# Patient Record
Sex: Male | Born: 1960 | Race: White | Hispanic: No | Marital: Married | Smoking: Former smoker
Health system: Southern US, Community
[De-identification: ages and names within clinical notes are randomized; demographics above are authoritative.]

## PROBLEM LIST (undated history)

## (undated) DIAGNOSIS — E7800 Pure hypercholesterolemia, unspecified: Secondary | ICD-10-CM

## (undated) DIAGNOSIS — F909 Attention-deficit hyperactivity disorder, unspecified type: Secondary | ICD-10-CM

## (undated) DIAGNOSIS — R7303 Prediabetes: Secondary | ICD-10-CM

## (undated) DIAGNOSIS — G4700 Insomnia, unspecified: Secondary | ICD-10-CM

## (undated) DIAGNOSIS — M2550 Pain in unspecified joint: Secondary | ICD-10-CM

## (undated) DIAGNOSIS — K829 Disease of gallbladder, unspecified: Secondary | ICD-10-CM

## (undated) DIAGNOSIS — F329 Major depressive disorder, single episode, unspecified: Secondary | ICD-10-CM

## (undated) DIAGNOSIS — G4730 Sleep apnea, unspecified: Secondary | ICD-10-CM

## (undated) DIAGNOSIS — E559 Vitamin D deficiency, unspecified: Secondary | ICD-10-CM

## (undated) DIAGNOSIS — F32A Depression, unspecified: Secondary | ICD-10-CM

## (undated) DIAGNOSIS — I10 Essential (primary) hypertension: Secondary | ICD-10-CM

## (undated) DIAGNOSIS — F1010 Alcohol abuse, uncomplicated: Secondary | ICD-10-CM

## (undated) DIAGNOSIS — F419 Anxiety disorder, unspecified: Secondary | ICD-10-CM

## (undated) DIAGNOSIS — R0602 Shortness of breath: Secondary | ICD-10-CM

## (undated) DIAGNOSIS — M549 Dorsalgia, unspecified: Secondary | ICD-10-CM

## (undated) HISTORY — DX: Prediabetes: R73.03

## (undated) HISTORY — DX: Pain in unspecified joint: M25.50

## (undated) HISTORY — PX: HERNIA REPAIR: SHX51

## (undated) HISTORY — DX: Sleep apnea, unspecified: G47.30

## (undated) HISTORY — DX: Essential (primary) hypertension: I10

## (undated) HISTORY — PX: OTHER SURGICAL HISTORY: SHX169

## (undated) HISTORY — PX: COLONOSCOPY: SHX174

## (undated) HISTORY — DX: Attention-deficit hyperactivity disorder, unspecified type: F90.9

## (undated) HISTORY — DX: Anxiety disorder, unspecified: F41.9

## (undated) HISTORY — DX: Alcohol abuse, uncomplicated: F10.10

## (undated) HISTORY — DX: Depression, unspecified: F32.A

## (undated) HISTORY — DX: Dorsalgia, unspecified: M54.9

## (undated) HISTORY — DX: Vitamin D deficiency, unspecified: E55.9

## (undated) HISTORY — DX: Shortness of breath: R06.02

## (undated) HISTORY — DX: Insomnia, unspecified: G47.00

## (undated) HISTORY — PX: POLYPECTOMY: SHX149

## (undated) HISTORY — PX: CHOLECYSTECTOMY: SHX55

## (undated) HISTORY — DX: Major depressive disorder, single episode, unspecified: F32.9

## (undated) HISTORY — DX: Pure hypercholesterolemia, unspecified: E78.00

## (undated) HISTORY — DX: Disease of gallbladder, unspecified: K82.9

---

## 2002-03-16 ENCOUNTER — Ambulatory Visit: Admission: RE | Admit: 2002-03-16 | Discharge: 2002-03-16 | Payer: Self-pay | Admitting: Family Medicine

## 2003-12-28 ENCOUNTER — Ambulatory Visit (HOSPITAL_COMMUNITY): Admission: RE | Admit: 2003-12-28 | Discharge: 2003-12-28 | Payer: Self-pay | Admitting: General Surgery

## 2004-05-06 ENCOUNTER — Inpatient Hospital Stay (HOSPITAL_COMMUNITY): Admission: EM | Admit: 2004-05-06 | Discharge: 2004-05-08 | Payer: Self-pay | Admitting: Emergency Medicine

## 2004-05-09 ENCOUNTER — Ambulatory Visit (HOSPITAL_COMMUNITY): Admission: RE | Admit: 2004-05-09 | Discharge: 2004-05-10 | Payer: Self-pay | Admitting: *Deleted

## 2004-06-21 ENCOUNTER — Ambulatory Visit (HOSPITAL_COMMUNITY): Admission: RE | Admit: 2004-06-21 | Discharge: 2004-06-21 | Payer: Self-pay | Admitting: Orthopaedic Surgery

## 2004-10-03 ENCOUNTER — Ambulatory Visit (HOSPITAL_COMMUNITY): Admission: RE | Admit: 2004-10-03 | Discharge: 2004-10-03 | Payer: Self-pay | Admitting: *Deleted

## 2004-10-03 ENCOUNTER — Ambulatory Visit (HOSPITAL_BASED_OUTPATIENT_CLINIC_OR_DEPARTMENT_OTHER): Admission: RE | Admit: 2004-10-03 | Discharge: 2004-10-03 | Payer: Self-pay | Admitting: *Deleted

## 2009-02-08 ENCOUNTER — Ambulatory Visit (HOSPITAL_COMMUNITY): Admission: RE | Admit: 2009-02-08 | Discharge: 2009-02-08 | Payer: Self-pay | Admitting: Family Medicine
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
## 2011-04-19 NOTE — H&P (Signed)
NAME:  Harold Trujillo, Harold Trujillo NO.:  [id number]   MEDICAL RECORD NO.:  [id number]                  PATIENT TYPE:   LOCATION:                                       FACILITY:   PHYSICIAN:  Dalia Heading, M.D.               DATE OF BIRTH:  Apr 02, 1961   DATE OF ADMISSION:  DATE OF DISCHARGE:                                HISTORY & PHYSICAL   CHIEF COMPLAINT:  Ventral hernia.   HISTORY OF PRESENT ILLNESS:  Patient is a 50 year old white male who was  referred for evaluation and treatment of a ventral hernia.  He was lifting  something heavy last week and felt something pop just above his umbilicus.  He has a mass which comes and goes.  It is occasionally sore to touch.  No  nausea or vomiting have been noted.   PAST MEDICAL HISTORY:  Depression.   PAST SURGICAL HISTORY:  Open cholecystectomy in the remote past.   CURRENT MEDICATIONS:  Paxil 20 mg p.o. q.d.   ALLERGIES:  PENICILLIN.   REVIEW OF SYSTEMS:  Patient does smoke a pack of cigarettes a day.  He  socially drinks alcohol.  He denies any other cardiopulmonary difficulties  or bleeding disorders.   PHYSICAL EXAMINATION:  VITAL SIGNS:  He is afebrile.  Vital signs are  stable.  GENERAL:  Patient is a well-developed and well-nourished white male in no  acute distress.  LUNGS:  Clear to auscultation with equal breath sounds bilaterally.  HEART:  Regular rate and rhythm without S3, S4, or murmurs.  ABDOMEN:  Soft and nondistended.  No hepatosplenomegaly or masses are noted.  There is a small, reducible ventral hernia that is noted just superior to  the umbilicus.   IMPRESSION:  Ventral hernia.   PLAN:  The patient is scheduled for a ventral herniorrhaphy on December 28, 2003.  The risks and benefits of the procedure, including bleeding,  infection, and the risks of recurrence of the hernia were fully explained to  the patient.  Gave informed consent.     ___________________________________________                                         Dalia Heading, M.D.   MAJ/MEDQ  D:  12/20/2003  T:  12/20/2003  Job:  [id number]   cc:   Donna Bernard, M.D.  [address]  [place]  [place] [address]  Fax: [phone]

## 2011-04-19 NOTE — Op Note (Signed)
NAME:  Harold Trujillo, Harold Trujillo                        ACCOUNT NO.:  [id number]   MEDICAL RECORD NO.:  [id number]                   PATIENT TYPE:  OIB   LOCATION:  3715                                 FACILITY:  [facility]   PHYSICIAN:  Lowell Bouton, M.D.      DATE OF BIRTH:  [birth year]   DATE OF PROCEDURE:  05/09/2004  DATE OF DISCHARGE:                                 OPERATIVE REPORT   PREOPERATIVE DIAGNOSIS:  Galeazzi fracture, left forearm.   POSTOPERATIVE DIAGNOSIS:  Galeazzi fracture, left forearm.   PROCEDURE:  Open reduction and internal fixation, left radius fracture, with  examination of distal radioulnar joint.   SURGEON:  Lowell Bouton, M.D.   ANESTHESIA:  General.   OPERATIVE FINDINGS:  The patient had a midshaft radius fracture that had a  small butterfly fragment.  There were good interdigitations of the fracture.  The distal radioulnar joint was dorsally dislocated.  Following plating of  the radius, the distal radioulnar joint was stable and did not require  pinning.   PROCEDURE:  Under general anesthesia with the tourniquet on the left arm,  the left hand was prepped and draped in the usual fashion and after  exsanguinating the limb, the tourniquet was inflated to 250 mmHg.  A  longitudinal incision was made volarly adjacent to the FCR tendon.  An  anterior Sherilyn Cooter approach was made to the radius.  Blunt dissection was  carried down through the fascia of the FCR, and it was retracted ulnarly.  The blunt dissection was carried down to the radial artery, which was  retracted ulnarly.  Branches of the artery were coagulated.  The interval  between the artery and the brachioradialis was identified and care was taken  to protect the radial sensory nerve.  The blunt dissection was carried down  to the pronator teres.  The forearm was pronated and the distal portion of  the pronator teres was elevated from the radius.  This was done sharply.  The fracture  site was just distal to the pronator teres insertion.  The  fractured ends of the bone were then cleaned of hematoma using a curette  both proximally and distally.  Two large clamps were applied to the proximal  and distal fragments of the radius and with longitudinal traction and  manipulation of the clamp, the fracture was reduced anatomically.  There  were good interdigitations to reveal the correct alignment of the bone.  There was a small butterfly fragment that was left adjacent to the fracture.  A five-hole large locking plate was used from the AO locking system.  The  plate was applied to the radius using the whirligig and a locking hole.  Two  locking screws were then placed distal to the fracture, holding it well-  aligned.  A third locking screw was placed in the most proximal hole and  then the whirligig was removed.  A fourth locking screw was placed proximal  to the  fracture.  X-rays revealed good alignment of the fracture, and there  was good stability.  Compression did not need to be applied, as the fracture  interdigitated well.  After plating the radius and obtaining x-rays, which  showed anatomic alignment, the distal radioulnar joint was examined and was  found to be completely stable.  It was then examined under fluoroscopy and  there was no dorsal subluxation of the ulna.  The joint appeared to be  stable.  The wound was then irrigated copiously with saline.  Subcutaneous  tissue was closed  with 4-0 Vicryl over a vessel loop drain.  The skin was closed with a 3-0  subcuticular Prolene.  Steri-Strips were applied, followed by sterile  dressings and a volar wrist splint.  The patient tolerated the procedure  well and went to the recovery room awake and stable, in good condition.                                               Lowell Bouton, M.D.    EMM/MEDQ  D:  05/09/2004  T:  05/10/2004  Job:  [id number]   cc:   Teola Bradley, M.D.  [phone] [address]  [place] [address]  Fax: [phone]   Donna Bernard, M.D.  [address]  [place]  [place] [address]  Fax: [phone]

## 2011-04-19 NOTE — Procedures (Signed)
NAME:  Harold Trujillo, Harold Trujillo NO.:  [id number]   MEDICAL RECORD NO.:  [id number]          PATIENT TYPE:  OUT   LOCATION:  DFTL                          FACILITY:  [facility]   PHYSICIAN:  Donna Bernard, M.D.DATE OF BIRTH:  [birth year]   DATE OF PROCEDURE:  DATE OF DISCHARGE:  02/08/2009                                  STRESS TEST   INDICATION FOR TEST:  This patient is a 50 year old white male with a  history of tobacco abuse and atypical chest discomfort.  Stress test was  done for risk stratification purposes.   Stress test was performed at standard Bruce protocol.  Resting EKG  revealed normal sinus rhythm with no significant ST-T changes.  The  patient tolerated the first 3 stages surprisingly well.  He made it to a  full minute and 30 seconds into the third stage.  At this point, at 0.08  seconds past the J-point, there were no significant ST-segment  depression noted in any of the leads.  The slope was sharply ascending  and the amount of depression was less than a mm.  The patient had  hypertensive responses expected during the recovery phase.  His EKG  normalized quickly.   IMPRESSION:  Negative adequate stress test.   PLAN:  The patient encouraged to get along with exercise program.      W. Simone Curia, M.D.  Electronically Signed     WSL/MEDQ  D:  02/17/2009  T:  02/18/2009  Job:  [id number]

## 2011-04-19 NOTE — Op Note (Signed)
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   PROCEDURE:  Open reduction-internal fixation, left radius fracture with left  iliac crest bone grafting.   SURGEON:  Lowell Bouton, M.D.   ASSISTANT:  Vanita Panda. Magnus Ivan, M.D.   ANESTHESIA:  General.   OPERATIVE FINDINGS:  The patient had bowing of his radius with subluxation  of his distal radial ulnar joint.  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   DESCRIPTION OF PROCEDURE:  Under general anesthesia with the tourniquet on  the left arm, the left arm and the left iliac crest were prepped and draped  in the usual fashion.  After exsanguinating the limb, the tourniquet was  inflated to 250 mmHg.  A longitudinal incision was made over the volar  aspect of the radius in line with the previous scar and extended both  proximally and distally.  Sharp dissection was carried through the  subcutaneous tissue and bleeding points were coagulated.  Blunt dissection  was carried through the subcutaneous tissue down to the FCR tendon which was  retracted ulnarly.  The radial artery was retracted radially.  The interval  was bluntly dissected out and the flexor tendons were retracted ulnarly.  Weitlaner retractors were inserted.  The forearm was supinated and the  supinator was removed from its attachment to the radius.  Similarly, the  pronator teres was released  from the attachment to the radius.  Dissection  was then carried down sharply onto the bone and there was bony callous  formation on either side of the fracture but there was fibrous tissue at the  fracture site.  The previous locking plate was removed and there was obvious  motion at the previous fracture site.  The fracture was then taken down with  a rongeur and a Freer elevator to free up the ends of the bone.  There was  some shortening with rounding off of the ends of the bone.  These were  distracted without difficulty using bone clamp and finally a laminar  spreader.  After distracting the bones gently over a period of time, x-rays  were taken showing reduction of the distal radial ulnar joint.  It was  decided that a 1-cm corticocancellous graft would be required to maintain  the length of the radius.  This was then taken from the left anterior iliac  crest by Dr. Magnus Ivan.  He used a saw to remove a 1-cm corticocancellous  graft that was tricortical from the top of the crest.  He then removed  cancellous bone for grafting.  The wound on the hip was then irrigated.  At  this point, it had been approximately 2 hours on the tourniquet so the  tourniquet was released.  The bone graft donor site was then packed with  Gelfoam and then the fascia was closed with 0 Vicryl, the subcutaneous  tissue was closed with 2-0 Vicryl and the skin with staples.  Sterile  dressings were applied.   The bone graft was then inserted in the defect in the radius after trimming  the edges of the rounded off radius back with a saw.  Care was taken to  irrigate copiously during any sawing to avoid burning of the bone.  The  graft was then inserted and x-rays showed adequate length and so a 0.062 K-  wire was placed longitudinally down the shaft of the radius to hold this  temporarily with the graft in place.  A 6-hole DC plate from the AO set was  then applied volarly to the radius, using six screws that  allowed for six  cortices on either side of the fracture.  X-rays showed good alignment of  the bone with a slight offset between the ends of the bone.  The patient had  good rotation on the table and it appeared as though his distal radial ulnar  joint was reduced.  The wound was then irrigated and the remaining  cancellous bone graft was inserted in the defect in the bone.   The wound was drained with a TLS drain and the subcutaneous tissue was  closed with 4-0 Vicryl and the skin with staples.  Sterile dressings were  applied.  The patient was placed in a sugar tong splint in supination.  0.5%  Marcaine was inserted in both wounds for pain control.  The tourniquet was  released a second time, he went to the recovery room awake, stable and in  good condition.       EMM/MEDQ  D:  10/04/2004  T:  10/04/2004  Job:  [id number]   cc:   Teola Bradley, M.D.  [phone] [address]  [place] [address]  Fax: [phone]

## 2011-04-19 NOTE — Op Note (Signed)
NAME:  Harold Trujillo, Harold Trujillo                        ACCOUNT NO.:  [id number]   MEDICAL RECORD NO.:  [id number]                   PATIENT TYPE:  AMB   LOCATION:  DAY                                  FACILITY:  [facility]   PHYSICIAN:  Dalia Heading, M.D.               DATE OF BIRTH:  1961/03/27   DATE OF PROCEDURE:  12/28/2003  DATE OF DISCHARGE:                                 OPERATIVE REPORT   PREOPERATIVE DIAGNOSIS:  Ventral hernia.   POSTOPERATIVE DIAGNOSIS:  Ventral hernia.   PROCEDURE:  Ventral herniorrhaphy.   SURGEON:  Dalia Heading, M.D.   ANESTHESIA:  General endotracheal.   INDICATIONS FOR PROCEDURE:  The patient is a 50 year old white male who is  referred for treatment of supraumbilical ventral hernia. The risks and  benefits of the procedure including bleeding, infection, and recurrence of  the hernia were fully explained to the patient, who gave informed consent.   DESCRIPTION OF PROCEDURE:  The patient was placed in the supine position.  After induction of general endotracheal anesthesia, the abdomen was prepped  and draped using the usual sterile technique with Betadine. Surgical site  confirmation was performed.   A longitudinal incision was made in the supraumbilical region.  This was  taken down to the fascia. There were two small hernias with preperitoneal  fat present.  Both areas with adipose tissue were excised without  difficulty.  The two defects were then made into one.  The fascia was then  reapproximated using #0 Surgidek interrupted sutures in a transverse  fashion. This was done in a two layer fashion. The subcutaneous layer was  reapproximated using a 2-0 Vicryl interrupted suture. The skin was closed  using staples.  Sensorcaine 0.5% was instilled into the surrounding wound.  Betadine ointment and dry sterile dressing were applied.   All tape and needle counts were correct at the end of the procedure. The  patient was extubated in the operating room  and went back to the recovery  room awake in stable condition.   COMPLICATIONS:  None.   SPECIMENS:  None.   ESTIMATED BLOOD LOSS:  Minimal.      ___________________________________________                                            Dalia Heading, M.D.   MAJ/MEDQ  D:  12/28/2003  T:  12/28/2003  Job:  [id number]   cc:   Donna Bernard, M.D.  [address]  [place]  [place] [address]  Fax: [phone]
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
## 2011-09-05 ENCOUNTER — Encounter: Payer: Self-pay | Admitting: Emergency Medicine

## 2011-09-05 ENCOUNTER — Emergency Department (HOSPITAL_COMMUNITY)
Admission: EM | Admit: 2011-09-05 | Discharge: 2011-09-05 | Disposition: A | Discharge status: Home / Self Care | Payer: BC Managed Care – PPO | Attending: Emergency Medicine | Admitting: Emergency Medicine

## 2011-09-05 ENCOUNTER — Other Ambulatory Visit: Payer: Self-pay

## 2011-09-05 ENCOUNTER — Emergency Department (HOSPITAL_COMMUNITY): Payer: BC Managed Care – PPO

## 2011-09-05 DIAGNOSIS — R079 Chest pain, unspecified: Secondary | ICD-10-CM | POA: Insufficient documentation

## 2011-09-05 DIAGNOSIS — Z87891 Personal history of nicotine dependence: Secondary | ICD-10-CM | POA: Insufficient documentation

## 2011-09-05 DIAGNOSIS — F411 Generalized anxiety disorder: Secondary | ICD-10-CM | POA: Insufficient documentation

## 2011-09-05 DIAGNOSIS — R42 Dizziness and giddiness: Secondary | ICD-10-CM | POA: Insufficient documentation

## 2011-09-05 LAB — CBC
HCT: 47.5 % (ref 39.0–52.0)
Hemoglobin: 16.7 g/dL (ref 13.0–17.0)
MCH: 31.1 pg (ref 26.0–34.0)
MCHC: 35.2 g/dL (ref 30.0–36.0)
Platelets: 273 10*3/uL (ref 150–400)
RDW: 13.2 % (ref 11.5–15.5)
WBC: 8.9 10*3/uL (ref 4.0–10.5)

## 2011-09-05 LAB — CARDIAC PANEL(CRET KIN+CKTOT+MB+TROPI)
CK, MB: 3.3 ng/mL (ref 0.3–4.0)
Relative Index: 1.7 (ref 0.0–2.5)
Troponin I: <0.3 ng/mL (ref <0.30)

## 2011-09-05 LAB — BASIC METABOLIC PANEL
Calcium: 9.7 mg/dL (ref 8.4–10.5)
Creatinine, Ser: 0.87 mg/dL (ref 0.50–1.35)
GFR calc non Af Amer: >90 mL/min (ref >90)

## 2011-09-05 LAB — POCT I-STAT TROPONIN I
Troponin i, poc: 0 ng/mL (ref 0.00–0.08)
Troponin i, poc: 0 ng/mL (ref 0.00–0.08)

## 2011-09-05 MED ORDER — NITROGLYCERIN 0.4 MG SL SUBL: 0.4000 mg | SUBLINGUAL_TABLET | SUBLINGUAL | Status: DC | PRN

## 2011-09-05 MED ORDER — ASPIRIN 81 MG PO CHEW
324.0000 mg | CHEWABLE_TABLET | Freq: Once | ORAL | Status: AC
  Administered 2011-09-05: 324 mg via ORAL
  Filled 2011-09-05: qty 4

## 2011-09-05 MED ORDER — SODIUM CHLORIDE 0.9 % IV SOLN
Freq: Once | INTRAVENOUS | Status: AC
  Administered 2011-09-05: 1000 mL via INTRAVENOUS
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
## 2011-09-05 NOTE — ED Notes (Addendum)
Dr. Sharol Given at [facility] for patient eval., per MD hold ASA and Ntg until she speaks with Dr. Bebe Shaggy.

## 2011-09-05 NOTE — ED Provider Notes (Signed)
History     CSN: [id number] Arrival date & time: 09/05/2011 11:42 AM  Chief Complaint  Patient presents with  . Chest Pain    (Consider location/radiation/quality/duration/timing/severity/associated sxs/prior treatment) Patient is a 50 y.o. male presenting with chest pain. The history is provided by the patient.  Chest Pain The chest pain began 6 - 12 hours ago (Started yesterday evening. Stressful day at work. Started having several episodes of similar chest pain this morning. When it kept occurring, called wife who brought him to ED. ). Chest pain occurs intermittently. The chest pain is improving. Associated with: Non-exertional. Happens at rest. Not relieved by rest.  The severity of the pain is moderate. The quality of the pain is described as pressure-like. Radiates to: Started on left side of chest and radiated to right side. Currently, pain is starting on right side and radiating to left. Does not radiate down arms or to jaws.  Chest pain is worsened by stress. Primary symptoms include shortness of breath. Pertinent negatives for primary symptoms include no fever, no fatigue, no cough, no wheezing, no palpitations, no abdominal pain, no nausea, no vomiting, no dizziness and no altered mental status.  Pertinent negatives for associated symptoms include no claudication, no diaphoresis, no lower extremity edema, no near-syncope, no numbness, no orthopnea and no weakness. He tried nothing for the symptoms. Risk factors include male gender and obesity (Used to drink heavily in the past. Stopped this about 2-3 months ago. Now drinks occasionally. Last drank 1 beer 2 days ago. Quit smoking 2.5 years ago. Exercises regularly and eating a healthier diet recently. ).  His past medical history is significant for hyperlipidemia and sleep apnea.  Pertinent negatives for past medical history include no COPD, no CHF, no diabetes and no hypertension.  His family medical history is significant for diabetes  in family and heart disease in family.     History reviewed. No pertinent past medical history.  Past Surgical History  Procedure Date  . Cholecystectomy   . Hernia repair   . For arm surgery forearm surgery L     History reviewed. No pertinent family history.  History  Substance Use Topics  . Smoking status: Former Games developer  . Smokeless tobacco: Not on file  . Alcohol Use: Yes     occasioinal      Review of Systems  Constitutional: Negative for fever, diaphoresis, appetite change and fatigue.  HENT: Negative for neck pain.   Eyes: Negative for visual disturbance.  Respiratory: Positive for chest tightness and shortness of breath. Negative for cough and wheezing.   Cardiovascular: Positive for chest pain. Negative for palpitations, orthopnea, claudication, leg swelling and near-syncope.  Gastrointestinal: Negative for nausea, vomiting, abdominal pain, diarrhea and constipation.  Genitourinary: Negative for dysuria, urgency, frequency and difficulty urinating.  Skin: Negative for rash.  Neurological: Positive for light-headedness. Negative for dizziness, weakness, numbness and headaches.  Psychiatric/Behavioral: Negative for confusion, dysphoric mood, agitation and altered mental status. The patient is nervous/anxious.     Allergies  Penicillins  Home Medications  No current outpatient prescriptions on file.  BP 134/64  Pulse 81  Temp(Src) 98.2 F (36.8 C) (Oral)  Resp 18  Ht 6\' 1"  (1.854 m)  Wt 275 lb (124.739 kg)  BMI 36.28 kg/m2  SpO2 98%  Physical Exam  Constitutional: He is oriented to person, place, and time. No distress.       Overweight Caucasian  HENT:  Head: Normocephalic and atraumatic.  Eyes: Conjunctivae are normal.  Neck:  Normal range of motion. Neck supple. No JVD present.  Cardiovascular: Normal rate, regular rhythm, normal heart sounds and intact distal pulses.  Exam reveals no gallop and no friction rub.   No murmur heard. Pulmonary/Chest:  Effort normal and breath sounds normal. No respiratory distress. He has no wheezes. He has no rales.  Abdominal: Soft. Bowel sounds are normal. He exhibits no distension. There is no tenderness. There is no guarding.  Musculoskeletal: He exhibits no edema and no tenderness.  Neurological: He is alert and oriented to person, place, and time. No cranial nerve deficit. He exhibits normal muscle tone. Coordination normal.  Skin: Skin is warm and dry. No rash noted. He is not diaphoretic. No erythema. No pallor.  Psychiatric: His speech is normal and behavior is normal. Judgment and thought content normal. His mood appears anxious. His affect is not angry, not blunt, not labile and not inappropriate. He is not agitated and not withdrawn. Cognition and memory are normal. He does not exhibit a depressed mood. He is attentive.    ED Course  Procedures (including critical care time)  ECG  Date: 09/05/2011  Rate: 76   Rhythm: normal sinus rhythm  QRS Axis: normal (96)  Intervals: normal  ST/T Wave abnormalities: normal  Conduction Disutrbances:none  Narrative Interpretation:   Old EKG Reviewed: none available    Labs Reviewed  CBC  BASIC METABOLIC PANEL  CARDIAC PANEL(CRET KIN+CKTOT+MB+TROPI)   No results found.   No diagnosis found.   MDM  Chest pain unlikely cardiac in origin. Two sets of troponins negative and ECG and CXR normal.  Spoke with PCP Dr. Gerda Diss. Plan is for patient to f/u tomorrow with Dr. Gerda Diss who will refer him to cardiologist for further work-up just to more fully rule this out.  Patient is amenable to plan and was given red flags to return to ED.       Lucianne [place] Resident 09/05/11 1359

## 2011-09-05 NOTE — ED Provider Notes (Signed)
Medical screening examination/treatment/procedure(s) were conducted as a shared visit with resident and myself.  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

## 2011-09-05 NOTE — ED Notes (Signed)
Dr. Bebe Shaggy to bs speaking with patient.

## 2012-11-06 ENCOUNTER — Other Ambulatory Visit: Payer: Self-pay | Admitting: Family Medicine

## 2012-11-06 ENCOUNTER — Ambulatory Visit (HOSPITAL_COMMUNITY)
Admission: RE | Admit: 2012-11-06 | Discharge: 2012-11-06 | Disposition: A | Discharge status: Home / Self Care | Payer: BC Managed Care – PPO | Source: Ambulatory Visit | Attending: Family Medicine | Admitting: Family Medicine

## 2012-11-06 DIAGNOSIS — M25561 Pain in right knee: Secondary | ICD-10-CM

## 2012-11-06 DIAGNOSIS — M25569 Pain in unspecified knee: Secondary | ICD-10-CM | POA: Insufficient documentation

## 2013-03-15 ENCOUNTER — Other Ambulatory Visit: Payer: Self-pay | Admitting: Family Medicine

## 2013-03-15 NOTE — Telephone Encounter (Signed)
Pt needs refill on xanax 1mg . Last chronic visit October4,2013

## 2013-03-15 NOTE — Telephone Encounter (Signed)
Ok times one 6 mo check up now due

## 2013-04-11 ENCOUNTER — Other Ambulatory Visit: Payer: Self-pay | Admitting: Family Medicine

## 2013-04-12 NOTE — Telephone Encounter (Signed)
Med called into [facility] plus 2 refills

## 2013-04-12 NOTE — Telephone Encounter (Signed)
May refill +2 refills

## 2013-06-16 ENCOUNTER — Other Ambulatory Visit: Payer: Self-pay | Admitting: Family Medicine

## 2013-06-16 NOTE — Telephone Encounter (Signed)
Ok plus one ref 

## 2013-06-17 NOTE — Telephone Encounter (Signed)
Ok plus two ref thought we did last eve 

## 2013-08-27 ENCOUNTER — Other Ambulatory Visit: Payer: Self-pay | Admitting: Family Medicine

## 2013-08-27 NOTE — Telephone Encounter (Signed)
When last seen?

## 2013-11-15 ENCOUNTER — Other Ambulatory Visit: Payer: Self-pay | Admitting: Family Medicine

## 2013-11-16 NOTE — Telephone Encounter (Signed)
Not seen in yr, needs ov

## 2013-11-23 ENCOUNTER — Other Ambulatory Visit: Payer: Self-pay | Admitting: Family Medicine

## 2013-12-08 ENCOUNTER — Ambulatory Visit (INDEPENDENT_AMBULATORY_CARE_PROVIDER_SITE_OTHER): Payer: BC Managed Care – PPO | Admitting: Family Medicine

## 2013-12-08 ENCOUNTER — Encounter: Payer: Self-pay | Admitting: Family Medicine

## 2013-12-08 VITALS — BP 112/80 | Ht 72.0 in | Wt 300.2 lb

## 2013-12-08 DIAGNOSIS — E785 Hyperlipidemia, unspecified: Secondary | ICD-10-CM

## 2013-12-08 DIAGNOSIS — G4700 Insomnia, unspecified: Secondary | ICD-10-CM

## 2013-12-08 DIAGNOSIS — G4733 Obstructive sleep apnea (adult) (pediatric): Secondary | ICD-10-CM

## 2013-12-08 DIAGNOSIS — Z125 Encounter for screening for malignant neoplasm of prostate: Secondary | ICD-10-CM

## 2013-12-08 DIAGNOSIS — F411 Generalized anxiety disorder: Secondary | ICD-10-CM

## 2013-12-08 DIAGNOSIS — Z79899 Other long term (current) drug therapy: Secondary | ICD-10-CM

## 2013-12-08 DIAGNOSIS — E291 Testicular hypofunction: Secondary | ICD-10-CM

## 2013-12-08 DIAGNOSIS — R7989 Other specified abnormal findings of blood chemistry: Secondary | ICD-10-CM

## 2013-12-08 MED ORDER — FENOFIBRATE 160 MG PO TABS: 160.0000 mg | ORAL_TABLET | Freq: Every day | ORAL | Status: DC

## 2013-12-08 MED ORDER — ALPRAZOLAM 1 MG PO TABS: ORAL_TABLET | ORAL | Status: DC

## 2013-12-08 NOTE — Progress Notes (Signed)
   Subjective:    Patient ID: Harold Trujillo, male    DOB: [birth year], 53 y.o.   MRN: [id number]  HPI Patient is here today for a follow up visit on his sleep issues. He needs his xanax prescription renewed and was told he needed an office visit first. He only takes this medication to help him sleep at times. No other concerns or issues at this time.   Just startedback on lipid meds. Had been off them for several months. Realizes he has initiate cholesterol.  Trouble sleeping at night , takes a half of xanax every night, takes most every night.  No longer taking testosterone, not exercsiisng much these days   off and on paxil use, pt has hx of using prn, despite our preference otherwise. Claims no suicidal or homicidal thoughts at this time.  Exercise lately has been poor but determined to get back on regular tract in this regard.   Review of Systems No headache no chest pain no back pain no change in bowel habits no blood in stool ROS otherwise negative    Objective:   Physical Exam  Alert obesity present vital stable. HEENT normal. Lungs clear. Heart rare in rhythm.      Assessment & Plan:  Impression 1 hyperlipidemia status uncertain discuss. #2 insomnia ongoing. #3 generalized anxiety disorder with element of depression clinically stable at this time. #4 obstructive sleep apnea unable to handle CPAP in the past. #5 morbid obesity plan diet discussed in encourage strongly. Medications refilled. Check every 6 months. Diet exercise discussed. Check appropriate blood work. WSL

## 2013-12-12 DIAGNOSIS — G4733 Obstructive sleep apnea (adult) (pediatric): Secondary | ICD-10-CM | POA: Insufficient documentation

## 2013-12-12 DIAGNOSIS — E785 Hyperlipidemia, unspecified: Secondary | ICD-10-CM | POA: Insufficient documentation

## 2013-12-12 DIAGNOSIS — G4700 Insomnia, unspecified: Secondary | ICD-10-CM | POA: Insufficient documentation

## 2013-12-12 DIAGNOSIS — R7989 Other specified abnormal findings of blood chemistry: Secondary | ICD-10-CM | POA: Insufficient documentation

## 2013-12-12 DIAGNOSIS — F411 Generalized anxiety disorder: Secondary | ICD-10-CM | POA: Insufficient documentation

## 2014-02-17 LAB — HEPATIC FUNCTION PANEL
ALBUMIN: 4.3 g/dL (ref 3.5–5.2)
ALT: 48 U/L (ref 0–53)
AST: 49 U/L — AB (ref 0–37)
Alkaline Phosphatase: 53 U/L (ref 39–117)
BILIRUBIN TOTAL: 0.4 mg/dL (ref 0.2–1.2)
Bilirubin, Direct: <0.1 mg/dL (ref 0.0–0.3)
Total Protein: 6.6 g/dL (ref 6.0–8.3)

## 2014-02-17 LAB — BASIC METABOLIC PANEL
BUN: 15 mg/dL (ref 6–23)
CALCIUM: 9.5 mg/dL (ref 8.4–10.5)
CO2: 22 meq/L (ref 19–32)
Chloride: 106 mEq/L (ref 96–112)
Creat: 0.8 mg/dL (ref 0.50–1.35)
Glucose, Bld: 101 mg/dL — ABNORMAL HIGH (ref 70–99)
POTASSIUM: 4.4 meq/L (ref 3.5–5.3)
SODIUM: 141 meq/L (ref 135–145)

## 2014-02-17 LAB — LIPID PANEL
CHOL/HDL RATIO: 5 ratio
Cholesterol: 218 mg/dL — ABNORMAL HIGH (ref 0–200)
HDL: 44 mg/dL (ref >39)
LDL CALC: 143 mg/dL — AB (ref 0–99)
Triglycerides: 157 mg/dL — ABNORMAL HIGH (ref <150)
VLDL: 31 mg/dL (ref 0–40)

## 2014-02-17 LAB — PSA: PSA: 0.6 ng/mL (ref <=4.00)

## 2014-02-23 ENCOUNTER — Encounter: Payer: Self-pay | Admitting: Family Medicine

## 2014-03-21 ENCOUNTER — Other Ambulatory Visit: Payer: Self-pay | Admitting: Family Medicine

## 2014-06-10 ENCOUNTER — Other Ambulatory Visit: Payer: Self-pay | Admitting: Family Medicine

## 2014-06-10 NOTE — Telephone Encounter (Signed)
Steve's pt: Last seen 1/7

## 2014-06-10 NOTE — Telephone Encounter (Signed)
This refill only patient does need followup office visit

## 2014-08-12 ENCOUNTER — Other Ambulatory Visit: Payer: Self-pay | Admitting: Family Medicine

## 2014-08-15 NOTE — Telephone Encounter (Signed)
May have one refill. Needs office visit.

## 2014-09-03 ENCOUNTER — Other Ambulatory Visit: Payer: Self-pay | Admitting: Family Medicine

## 2014-09-05 NOTE — Telephone Encounter (Signed)
Dr. Minette Brine patient last seen in January please send this to him

## 2014-09-06 NOTE — Telephone Encounter (Signed)
One mo only 6 mo ov overdue

## 2014-09-27 ENCOUNTER — Other Ambulatory Visit: Payer: Self-pay | Admitting: Family Medicine

## 2014-12-26 ENCOUNTER — Other Ambulatory Visit: Payer: Self-pay | Admitting: Family Medicine

## 2015-02-03 ENCOUNTER — Ambulatory Visit (INDEPENDENT_AMBULATORY_CARE_PROVIDER_SITE_OTHER): Payer: BLUE CROSS/BLUE SHIELD | Admitting: Family Medicine

## 2015-02-03 ENCOUNTER — Encounter: Payer: Self-pay | Admitting: Family Medicine

## 2015-02-03 VITALS — BP 124/78 | Ht 72.0 in | Wt 295.1 lb

## 2015-02-03 DIAGNOSIS — E785 Hyperlipidemia, unspecified: Secondary | ICD-10-CM | POA: Diagnosis not present

## 2015-02-03 DIAGNOSIS — M25569 Pain in unspecified knee: Secondary | ICD-10-CM | POA: Diagnosis not present

## 2015-02-03 DIAGNOSIS — F411 Generalized anxiety disorder: Secondary | ICD-10-CM

## 2015-02-03 DIAGNOSIS — G4700 Insomnia, unspecified: Secondary | ICD-10-CM | POA: Diagnosis not present

## 2015-02-03 MED ORDER — ALPRAZOLAM 1 MG PO TABS: ORAL_TABLET | ORAL | Status: DC

## 2015-02-03 MED ORDER — FENOFIBRATE 160 MG PO TABS: ORAL_TABLET | ORAL | Status: DC

## 2015-02-03 NOTE — Progress Notes (Signed)
   Subjective:    Patient ID: Harold Trujillo, male    DOB: 22-Jul-1961, 54 y.o.   MRN: [id number]  Hyperlipidemia This is a chronic problem. The current episode started more than 1 year ago. The problem is controlled. Recent lipid tests were reviewed and are normal. There are no known factors aggravating his hyperlipidemia. Treatments tried: fenofibrate. The current treatment provides significant improvement of lipids. There are no compliance problems.  There are no known risk factors for coronary artery disease.   Patient states he has bilateral knee and shin pain at times. He would like to talk with the doctor about this.   Not taking paxil , doesn't seem to help long term  Takes xanax regulaly, works at [employer] in [place]. States he definitely needs it to help his sleep.  Said just received all necessary blood work within the past week at [facility].  Often takes one and a half  Exercise stys active and covdring a lot of ground    Review of Systems No headache no chest pain no back pain ongoing weight gain no change in bowel habits    Objective:   Physical Exam Alert vital stable. HEENT normal. Lungs clear heart regular rhythm legs pulses good good range of motion knee no significant crepitations some tender pain anterior shins both sides       Assessment & Plan:  Impression 1 hyperlipidemia exact status uncertain #2 leg pain overuse in nature shin splints equivalent #3 insomnia discussed plan Xanax refilled. Diet exercise discussed. Bring blood work in. All medicines refilled. Recheck in 6 months. Wellness plus lipid visit then. WSL

## 2015-02-21 ENCOUNTER — Encounter: Payer: Self-pay | Admitting: *Deleted

## 2015-05-13 ENCOUNTER — Other Ambulatory Visit: Payer: Self-pay | Admitting: Family Medicine

## 2015-08-08 ENCOUNTER — Other Ambulatory Visit: Payer: Self-pay | Admitting: Family Medicine

## 2015-08-08 ENCOUNTER — Other Ambulatory Visit: Payer: Self-pay | Admitting: *Deleted

## 2015-08-08 MED ORDER — FENOFIBRATE 160 MG PO TABS: ORAL_TABLET | ORAL | Status: DC

## 2015-08-08 NOTE — Telephone Encounter (Signed)
30 d six mo f u due

## 2015-08-30 ENCOUNTER — Ambulatory Visit (INDEPENDENT_AMBULATORY_CARE_PROVIDER_SITE_OTHER): Payer: BLUE CROSS/BLUE SHIELD | Admitting: Family Medicine

## 2015-08-30 ENCOUNTER — Encounter: Payer: Self-pay | Admitting: Family Medicine

## 2015-08-30 VITALS — BP 122/78 | Ht 72.0 in | Wt 286.0 lb

## 2015-08-30 DIAGNOSIS — E785 Hyperlipidemia, unspecified: Secondary | ICD-10-CM | POA: Diagnosis not present

## 2015-08-30 DIAGNOSIS — Z79899 Other long term (current) drug therapy: Secondary | ICD-10-CM

## 2015-08-30 DIAGNOSIS — N5201 Erectile dysfunction due to arterial insufficiency: Secondary | ICD-10-CM | POA: Diagnosis not present

## 2015-08-30 DIAGNOSIS — G4700 Insomnia, unspecified: Secondary | ICD-10-CM | POA: Diagnosis not present

## 2015-08-30 DIAGNOSIS — Z23 Encounter for immunization: Secondary | ICD-10-CM | POA: Diagnosis not present

## 2015-08-30 MED ORDER — SILDENAFIL CITRATE 20 MG PO TABS: ORAL_TABLET | ORAL | Status: DC

## 2015-08-30 MED ORDER — ALPRAZOLAM 1 MG PO TABS: ORAL_TABLET | ORAL | Status: DC

## 2015-08-30 MED ORDER — PAROXETINE HCL 20 MG PO TABS: 20.0000 mg | ORAL_TABLET | Freq: Every day | ORAL | Status: DC

## 2015-08-30 MED ORDER — FENOFIBRATE 160 MG PO TABS: ORAL_TABLET | ORAL | Status: DC

## 2015-08-30 NOTE — Progress Notes (Signed)
   Subjective:    Patient ID: Harold Trujillo, male    DOB: 1961/04/06, 54 y.o.   MRN: [id number]  Hyperlipidemia This is a chronic problem. The current episode started more than 1 year ago. There are no compliance problems (exercises three times a week. eats health conscious).    No problems or concerns. meds working good.   Pt needs refills on all meds.  Patient claims depression is clinically stable. Tends to use the Paxil on a when necessary basis. States this is always worked for him and he declines to use it every single day. However with ongoing stress he has used it regularly for the last 6 months.  States deftly needs the Xanax primarily at night to help sleep.  Claims compliance with lipid medication. Next  Notes emergency and worsening of erectile dysfunction. Would like to try something for it. Would like to try generic Viagra if possible Lipid profile ,  paxil definitely helps, takes the med on a prn basis, Prefers totake it that way  One half xanax qhs prn sleep , def helps so would like refill  Pt also notes dimished     Stays with it Flu vaccine today.    Review of Systems No headache no chest pain no back pain abdominal pain no change in bowel habits no blood in stool    Objective:   Physical Exam  Alert vital stable HET normal neck supple. Lungs clear heart rare rhythm abdomen benign      Assessment & Plan:  Impression #1 r erectile dysfunction discussed for a substantial discussion. Patient would like to try medicine risks benefits cost discussed at length #2 hyperlipidemia status uncertain patient missed #3 depression currently stable #4 insomnia stable plan trial of fiber. Preventive physical in 3 months. Appropriate blood work. Medications refilled. WSL

## 2015-08-31 ENCOUNTER — Telehealth: Payer: Self-pay | Admitting: Family Medicine

## 2015-08-31 NOTE — Telephone Encounter (Signed)
Patient stated he was making sure all his meds were sent to [facility] and not [facility]. All meds were [facility] to [facility].

## 2015-08-31 NOTE — Telephone Encounter (Signed)
Patient had meds called in yesterday after his appointment, but they were sent to [facility].  He would like these medications called in to [facility].    ALPRAZolam (XANAX) 1 MG tablet fenofibrate 160 MG tablet PARoxetine (PAXIL) 20 MG tablet sildenafil (REVATIO) 20 MG tablet

## 2015-09-14 ENCOUNTER — Telehealth: Payer: Self-pay | Admitting: Family Medicine

## 2015-09-14 MED ORDER — SILDENAFIL CITRATE 20 MG PO TABS: ORAL_TABLET | ORAL | Status: DC

## 2015-09-14 NOTE — Telephone Encounter (Signed)
Patient is having issues getting his sildenafil (REVATIO) 20 MG tablet filled with [facility].  He wants to know if we can send in Rx for this to [facility] and he will pay out of pocket.

## 2015-09-14 NOTE — Telephone Encounter (Signed)
Ok lets 

## 2015-09-14 NOTE — Telephone Encounter (Signed)
Notified patient med was sent to [facility].

## 2015-12-11 ENCOUNTER — Ambulatory Visit (INDEPENDENT_AMBULATORY_CARE_PROVIDER_SITE_OTHER): Payer: BLUE CROSS/BLUE SHIELD | Admitting: Family Medicine

## 2015-12-11 ENCOUNTER — Encounter: Payer: Self-pay | Admitting: Family Medicine

## 2015-12-11 VITALS — BP 122/78 | Ht 72.0 in | Wt 298.0 lb

## 2015-12-11 DIAGNOSIS — S060X0A Concussion without loss of consciousness, initial encounter: Secondary | ICD-10-CM | POA: Diagnosis not present

## 2015-12-11 MED ORDER — ETODOLAC 400 MG PO TABS: 400.0000 mg | ORAL_TABLET | Freq: Two times a day (BID) | ORAL | Status: DC

## 2015-12-11 NOTE — Progress Notes (Signed)
   Subjective:    Patient ID: Harold Trujillo, male    DOB: [birth year], 55 y.o.   MRN: [id number]  HPI Patient arrives with c/o neck pain and dizziness s/p fall. Patient went to urgent care yesterday and was told he had vertigo, but patient does not feel it is vertigo and did not pick up med prescribed.  Some headache and slight diziness.  Feeling a little anxious but maybe paxil, p t may have also had panic attack  Went on to urgent car e in [place], eval there, h rate was 80.  Ibuprofen prn pain, exp upper cdrvical midline pain  Fuzziness at times after the fall and head injujry    Patient took a fall struck his head hard. Was not knocked out. Since then has been fuzzy thinking. No nausea. No loss of consciousness. No seizures. Just doesn't feel right. Hasn't unsteadiness not true vertigo but unsteadiness it occurs next  Notes some residual neck pain. Using anti-inflammatory medicine it's helped some    Review of Systems No chest pain no shortness breath no abdominal pain ROS otherwise negative    Objective:   Physical Exam  Alert vitals stable HEENT normal cerebellar function normal funduscopic exam normal neck supple some pain with lateral motion in flexion lungs clear. Heart regular in rhythm no focal neurological deficits      Assessment & Plan:  Impression probable mild concussion with residual soft neurological symptomatology discussed at great length plan no need for scanning at this time. Very low likelihood of helping the patient. Symptom care discussed expect gradual resolution 25 minutes spent most in discussion WSL

## 2015-12-13 ENCOUNTER — Encounter: Payer: Self-pay | Admitting: Family Medicine

## 2016-02-21 ENCOUNTER — Other Ambulatory Visit: Payer: Self-pay | Admitting: Family Medicine

## 2016-02-26 ENCOUNTER — Telehealth: Payer: Self-pay | Admitting: Family Medicine

## 2016-02-26 MED ORDER — ALPRAZOLAM 1 MG PO TABS: ORAL_TABLET | ORAL | Status: DC

## 2016-02-26 NOTE — Telephone Encounter (Signed)
Ok plus 2 monthly ref 

## 2016-02-26 NOTE — Telephone Encounter (Signed)
Rx faxed to pharmacy. Patient notified. 

## 2016-02-26 NOTE — Telephone Encounter (Signed)
Pt is needing a refill on his alprazolam [facility] in [place]

## 2016-05-04 ENCOUNTER — Other Ambulatory Visit: Payer: Self-pay | Admitting: Family Medicine

## 2016-05-06 MED ORDER — SILDENAFIL CITRATE 20 MG PO TABS: ORAL_TABLET | ORAL | Status: DC

## 2016-05-06 NOTE — Telephone Encounter (Signed)
Simply put, no, higher dose is non generic and much pricier

## 2016-08-19 ENCOUNTER — Encounter: Payer: Self-pay | Admitting: Family Medicine

## 2016-08-19 MED ORDER — ALPRAZOLAM 1 MG PO TABS: ORAL_TABLET | ORAL | 0 refills | Status: DC

## 2016-08-27 ENCOUNTER — Ambulatory Visit (INDEPENDENT_AMBULATORY_CARE_PROVIDER_SITE_OTHER): Payer: BLUE CROSS/BLUE SHIELD | Admitting: Family Medicine

## 2016-08-27 ENCOUNTER — Encounter: Payer: Self-pay | Admitting: Family Medicine

## 2016-08-27 VITALS — BP 128/86 | Ht 72.0 in | Wt 287.0 lb

## 2016-08-27 DIAGNOSIS — Z79899 Other long term (current) drug therapy: Secondary | ICD-10-CM | POA: Diagnosis not present

## 2016-08-27 DIAGNOSIS — F411 Generalized anxiety disorder: Secondary | ICD-10-CM | POA: Diagnosis not present

## 2016-08-27 DIAGNOSIS — N5201 Erectile dysfunction due to arterial insufficiency: Secondary | ICD-10-CM | POA: Diagnosis not present

## 2016-08-27 DIAGNOSIS — G4700 Insomnia, unspecified: Secondary | ICD-10-CM | POA: Diagnosis not present

## 2016-08-27 DIAGNOSIS — Z125 Encounter for screening for malignant neoplasm of prostate: Secondary | ICD-10-CM

## 2016-08-27 DIAGNOSIS — E785 Hyperlipidemia, unspecified: Secondary | ICD-10-CM | POA: Diagnosis not present

## 2016-08-27 MED ORDER — PAROXETINE HCL 20 MG PO TABS: 20.0000 mg | ORAL_TABLET | Freq: Every day | ORAL | 1 refills | Status: DC

## 2016-08-27 MED ORDER — ALPRAZOLAM 1 MG PO TABS: ORAL_TABLET | ORAL | 5 refills | Status: DC

## 2016-08-27 MED ORDER — SILDENAFIL CITRATE 20 MG PO TABS: ORAL_TABLET | ORAL | 5 refills | Status: DC

## 2016-08-27 NOTE — Progress Notes (Signed)
  Patient arrives office with numerous concerns Subjective:    Patient ID: Harold Trujillo, male    DOB: [birth year], 55 y.o.   MRN: [id number]  HPIpt wants to get screening colonoscopy.pos  No hx of colon exam in past  Declines flu vaccine.   Will get at work. Works night shift, often uses xn x to help with sleep, works for [employer], night shift.  Three to four d    Needs tetanus. Pt notified to go to pharm to get.    Needs check up on xanax.States his anxiety often is very considerable. When necessary Xanax definitely helps him. He also uses Paxil on a when necessary basis and has found this to work form down through the years. No major depression and this time no suicidal thoughts.  Compliant with his erectile dysfunction meds. States overall helps him. No obvious side effects. Requests refills on these at this time  Stopped taking fenofibrate because he changed diet and started exercising. Patient claims compliance with his diet. However he has stopped the feeding of 5 rate. He is interested house numbers look without it    Review of Systems No headache, no major weight loss or weight gain, no chest pain no back pain abdominal pain no change in bowel habits complete ROS otherwise negative     Objective:   Physical Exam Alert vitals stable, NAD. Blood pressure good on repeat. HEENT normal. Lungs clear. Heart regular rate and rhythm.        Assessment & Plan:  Impression 1 chronic anxiety/depression was somewhat atypical use of medications discussed patient wishes to maintain #2 hypertriglyceridemia with numbers high enough for the past 1 meds. Noncompliance this time discussed #3 erectile dysfunction ongoing need for refill medications #4 patient admits to some alcohol abuse on weekends and is going to seek out some professional help through his workplace. Encouraged strongly to do this. Plan will help set up for colonoscopy

## 2016-08-29 ENCOUNTER — Encounter: Payer: Self-pay | Admitting: Internal Medicine

## 2016-08-29 ENCOUNTER — Encounter: Payer: Self-pay | Admitting: Family Medicine

## 2016-08-29 ENCOUNTER — Telehealth: Payer: Self-pay

## 2016-08-29 NOTE — Telephone Encounter (Signed)
Pt called to say that his PCP told him to call us to set up his colonoscopy. Pt works 3rd shift and said if he doesn't answer (he was asleep) to leave a message. [phone]

## 2016-08-31 LAB — HEPATIC FUNCTION PANEL
ALBUMIN: 4.6 g/dL (ref 3.5–5.5)
ALT: 54 IU/L — AB (ref 0–44)
AST: 45 IU/L — AB (ref 0–40)
Alkaline Phosphatase: 73 IU/L (ref 39–117)
BILIRUBIN TOTAL: 0.7 mg/dL (ref 0.0–1.2)
BILIRUBIN, DIRECT: 0.16 mg/dL (ref 0.00–0.40)
Total Protein: 6.8 g/dL (ref 6.0–8.5)

## 2016-08-31 LAB — BASIC METABOLIC PANEL
BUN / CREAT RATIO: 8 — AB (ref 9–20)
BUN: 8 mg/dL (ref 6–24)
CALCIUM: 9.3 mg/dL (ref 8.7–10.2)
CO2: 24 mmol/L (ref 18–29)
Chloride: 102 mmol/L (ref 96–106)
Creatinine, Ser: 0.97 mg/dL (ref 0.76–1.27)
GFR, EST AFRICAN AMERICAN: 103 mL/min/{1.73_m2} (ref >59)
GFR, EST NON AFRICAN AMERICAN: 89 mL/min/{1.73_m2} (ref >59)
Glucose: 82 mg/dL (ref 65–99)
Potassium: 3.9 mmol/L (ref 3.5–5.2)
Sodium: 142 mmol/L (ref 134–144)

## 2016-08-31 LAB — LIPID PANEL
CHOL/HDL RATIO: 5.4 ratio — AB (ref 0.0–5.0)
CHOLESTEROL TOTAL: 215 mg/dL — AB (ref 100–199)
HDL: 40 mg/dL (ref >39)
LDL CALC: 132 mg/dL — AB (ref 0–99)
TRIGLYCERIDES: 217 mg/dL — AB (ref 0–149)
VLDL Cholesterol Cal: 43 mg/dL — ABNORMAL HIGH (ref 5–40)

## 2016-08-31 LAB — PSA: Prostate Specific Ag, Serum: 0.7 ng/mL (ref 0.0–4.0)

## 2016-09-02 ENCOUNTER — Encounter: Payer: Self-pay | Admitting: Family Medicine

## 2016-09-02 NOTE — Telephone Encounter (Signed)
LMOM for a return call. ( Referral was sent to Dr. Laural Golden, pt has appt at [facility] on 09/26/2016).

## 2016-09-03 NOTE — Telephone Encounter (Signed)
Pt is scheduled with [facility] for the colonoscopy.

## 2016-09-10 ENCOUNTER — Ambulatory Visit (AMBULATORY_SURGERY_CENTER): Payer: Self-pay | Admitting: *Deleted

## 2016-09-10 VITALS — Ht 72.0 in | Wt 291.0 lb

## 2016-09-10 DIAGNOSIS — Z1211 Encounter for screening for malignant neoplasm of colon: Secondary | ICD-10-CM

## 2016-09-10 MED ORDER — NA SULFATE-K SULFATE-MG SULF 17.5-3.13-1.6 GM/177ML PO SOLN: ORAL | 0 refills | Status: DC

## 2016-09-10 NOTE — Progress Notes (Signed)
Patient denies any allergies to eggs or soy. Patient denies any problems with anesthesia/sedation. Patient denies any oxygen use at home and does not take any diet/weight loss medications.  

## 2016-09-16 ENCOUNTER — Encounter: Payer: Self-pay | Admitting: Internal Medicine

## 2016-09-16 ENCOUNTER — Encounter: Payer: Self-pay | Admitting: Family Medicine

## 2016-09-26 ENCOUNTER — Encounter: Payer: Self-pay | Admitting: Internal Medicine

## 2016-09-26 ENCOUNTER — Ambulatory Visit (AMBULATORY_SURGERY_CENTER): Payer: BLUE CROSS/BLUE SHIELD | Admitting: Internal Medicine

## 2016-09-26 VITALS — BP 122/74 | HR 60 | Temp 99.1°F | Resp 19 | Ht 72.0 in | Wt 291.0 lb

## 2016-09-26 DIAGNOSIS — Z1212 Encounter for screening for malignant neoplasm of rectum: Secondary | ICD-10-CM

## 2016-09-26 DIAGNOSIS — Z1211 Encounter for screening for malignant neoplasm of colon: Secondary | ICD-10-CM | POA: Diagnosis present

## 2016-09-26 DIAGNOSIS — D123 Benign neoplasm of transverse colon: Secondary | ICD-10-CM

## 2016-09-26 MED ORDER — SODIUM CHLORIDE 0.9 % IV SOLN: 500.0000 mL | INTRAVENOUS | Status: DC

## 2016-09-26 NOTE — Patient Instructions (Signed)
YOU HAD AN ENDOSCOPIC PROCEDURE TODAY AT THE [facility]:   Refer to the procedure report that was given to you for any specific questions about what was found during the examination.  If the procedure report does not answer your questions, please call your gastroenterologist to clarify.  If you requested that your care partner not be given the details of your procedure findings, then the procedure report has been included in a sealed envelope for you to review at your convenience later.  YOU SHOULD EXPECT: Some feelings of bloating in the abdomen. Passage of more gas than usual.  Walking can help get rid of the air that was put into your GI tract during the procedure and reduce the bloating. If you had a lower endoscopy (such as a colonoscopy or flexible sigmoidoscopy) you may notice spotting of blood in your stool or on the toilet paper. If you underwent a bowel prep for your procedure, you may not have a normal bowel movement for a few days.  Please Note:  You might notice some irritation and congestion in your nose or some drainage.  This is from the oxygen used during your procedure.  There is no need for concern and it should clear up in a day or so.  SYMPTOMS TO REPORT IMMEDIATELY:   Following lower endoscopy (colonoscopy or flexible sigmoidoscopy):  Excessive amounts of blood in the stool  Significant tenderness or worsening of abdominal pains  Swelling of the abdomen that is new, acute  Fever of 100F or higher    For urgent or emergent issues, a gastroenterologist can be reached at any hour by calling [phone].   DIET:  We do recommend a small meal at first, but then you may proceed to your regular diet.  Drink plenty of fluids but you should avoid alcoholic beverages for 24 hours.  ACTIVITY:  You should plan to take it easy for the rest of today and you should NOT DRIVE or use heavy machinery until tomorrow (because of the sedation medicines used during the test).     FOLLOW UP: Our staff will call the number listed on your records the next business day following your procedure to check on you and address any questions or concerns that you may have regarding the information given to you following your procedure. If we do not reach you, we will leave a message.  However, if you are feeling well and you are not experiencing any problems, there is no need to return our call.  We will assume that you have returned to your regular daily activities without incident.  If any biopsies were taken you will be contacted by phone or by letter within the next 1-3 weeks.  Please call us at [phone] if you have not heard about the biopsies in 3 weeks.    SIGNATURES/CONFIDENTIALITY: You and/or your care partner have signed paperwork which will be entered into your electronic medical record.  These signatures attest to the fact that that the information above on your After Visit Summary has been reviewed and is understood.  Full responsibility of the confidentiality of this discharge information lies with you and/or your care-partner.   INFORMATION ON POLYPS,DIVERTICULOSIS,& HEMORRHOIDS GIVEN TO YOU TODAY 

## 2016-09-26 NOTE — Op Note (Signed)
[facility] Patient Name: Harold Trujillo Procedure Date: 09/26/2016 2:49 PM MRN: [id number] Endoscopist: Jerene Bears , MD Age: 55 Referring MD:  Date of Birth: Dec 09, 1960 Gender: Male Account #: [id number] Procedure:                Colonoscopy Indications:              Screening for colorectal malignant neoplasm, This                            is the patient's first colonoscopy Medicines:                Monitored Anesthesia Care Procedure:                Pre-Anesthesia Assessment:                           - Prior to the procedure, a History and Physical                            was performed, and patient medications and                            allergies were reviewed. The patient's tolerance of                            previous anesthesia was also reviewed. The risks                            and benefits of the procedure and the sedation                            options and risks were discussed with the patient.                            All questions were answered, and informed consent                            was obtained. Prior Anticoagulants: The patient has                            taken no previous anticoagulant or antiplatelet                            agents. ASA Grade Assessment: II - A patient with                            mild systemic disease. After reviewing the risks                            and benefits, the patient was deemed in                            satisfactory condition to undergo the procedure.  After obtaining informed consent, the colonoscope                            was passed under direct vision. Throughout the                            procedure, the patient's blood pressure, pulse, and                            oxygen saturations were monitored continuously. The                            Model CF-HQ190L [id number]) scope was introduced                            through the anus and  advanced to the the cecum,                            identified by appendiceal orifice and ileocecal                            valve. The colonoscopy was performed without                            difficulty. The patient tolerated the procedure                            well. The quality of the bowel preparation was                            good. The ileocecal valve, appendiceal orifice, and                            rectum were photographed. Scope In: 2:59:19 PM Scope Out: 3:16:10 PM Scope Withdrawal Time: 0 hours 13 minutes 54 seconds  Total Procedure Duration: 0 hours 16 minutes 51 seconds  Findings:                 The digital rectal exam was normal.                           A 4 mm polyp was found in the proximal transverse                            colon. The polyp was sessile. The polyp was removed                            with a cold snare. Resection and retrieval were                            complete.                           A few small-mouthed diverticula were found in the  hepatic flexure.                           Internal hemorrhoids were found during                            retroflexion. The hemorrhoids were small.                           The exam was otherwise without abnormality. Complications:            No immediate complications. Estimated Blood Loss:     Estimated blood loss was minimal. Impression:               - One 4 mm polyp in the proximal transverse colon,                            removed with a cold snare. Resected and retrieved.                           - Diverticulosis at the hepatic flexure.                           - Internal hemorrhoids.                           - The examination was otherwise normal. Recommendation:           - Patient has a contact number available for                            emergencies. The signs and symptoms of potential                            delayed complications were  discussed with the                            patient. Return to normal activities tomorrow.                            Written discharge instructions were provided to the                            patient.                           - Resume previous diet.                           - Continue present medications.                           - Await pathology results.                           - Repeat colonoscopy is recommended for  surveillance. The colonoscopy date will be                            determined after pathology results from today's                            exam become available for review. Jerene Bears, MD 09/26/2016 3:18:53 PM This report has been signed electronically.

## 2016-09-26 NOTE — Progress Notes (Signed)
A and O x3. Report to RN. Tolerated MAC anesthesia well. 

## 2016-09-26 NOTE — Progress Notes (Signed)
Called to room to assist during endoscopic procedure.  Patient ID and intended procedure confirmed with present staff. Received instructions for my participation in the procedure from the performing physician.  

## 2016-09-27 ENCOUNTER — Telehealth: Payer: Self-pay

## 2016-09-27 ENCOUNTER — Telehealth: Payer: Self-pay | Admitting: *Deleted

## 2016-09-27 NOTE — Telephone Encounter (Signed)
  Follow up Call-  Call back number 09/26/2016  Post procedure Call Back phone  # [phone]  Permission to leave phone message Yes  Some recent data might be hidden     Patient questions:  Do you have a fever, pain , or abdominal swelling? No. Pain Score  0 *  Have you tolerated food without any problems? Yes.    Have you been able to return to your normal activities? Yes.    Do you have any questions about your discharge instructions: Diet   No. Medications  No. Follow up visit  No.  Do you have questions or concerns about your Care? No.  Actions: * If pain score is 4 or above: No action needed, pain <4.

## 2016-09-27 NOTE — Telephone Encounter (Signed)
  Follow up Call-  Call back number 09/26/2016  Post procedure Call Back phone  # [phone]  Permission to leave phone message Yes  Some recent data might be hidden    Patient was called for follow up after his procedure on 09/26/2016. No answer at the number given for follow up phone call. A message was left on the answering machine.

## 2016-10-03 ENCOUNTER — Encounter: Payer: Self-pay | Admitting: Internal Medicine

## 2016-10-15 ENCOUNTER — Encounter: Payer: Self-pay | Admitting: Family Medicine

## 2017-01-12 ENCOUNTER — Emergency Department (HOSPITAL_COMMUNITY)
Admission: EM | Admit: 2017-01-12 | Discharge: 2017-01-12 | Disposition: A | Discharge status: Home / Self Care | Payer: BLUE CROSS/BLUE SHIELD | Attending: Emergency Medicine | Admitting: Emergency Medicine

## 2017-01-12 ENCOUNTER — Emergency Department (HOSPITAL_COMMUNITY): Payer: BLUE CROSS/BLUE SHIELD

## 2017-01-12 ENCOUNTER — Encounter (HOSPITAL_COMMUNITY): Payer: Self-pay | Admitting: *Deleted

## 2017-01-12 DIAGNOSIS — F909 Attention-deficit hyperactivity disorder, unspecified type: Secondary | ICD-10-CM | POA: Insufficient documentation

## 2017-01-12 DIAGNOSIS — S0181XA Laceration without foreign body of other part of head, initial encounter: Secondary | ICD-10-CM | POA: Insufficient documentation

## 2017-01-12 DIAGNOSIS — Y999 Unspecified external cause status: Secondary | ICD-10-CM | POA: Insufficient documentation

## 2017-01-12 DIAGNOSIS — S161XXA Strain of muscle, fascia and tendon at neck level, initial encounter: Secondary | ICD-10-CM | POA: Insufficient documentation

## 2017-01-12 DIAGNOSIS — S01112A Laceration without foreign body of left eyelid and periocular area, initial encounter: Secondary | ICD-10-CM | POA: Insufficient documentation

## 2017-01-12 DIAGNOSIS — Y939 Activity, unspecified: Secondary | ICD-10-CM | POA: Diagnosis not present

## 2017-01-12 DIAGNOSIS — Z79899 Other long term (current) drug therapy: Secondary | ICD-10-CM | POA: Diagnosis not present

## 2017-01-12 DIAGNOSIS — Y929 Unspecified place or not applicable: Secondary | ICD-10-CM | POA: Diagnosis not present

## 2017-01-12 DIAGNOSIS — W0110XA Fall on same level from slipping, tripping and stumbling with subsequent striking against unspecified object, initial encounter: Secondary | ICD-10-CM | POA: Diagnosis not present

## 2017-01-12 DIAGNOSIS — F32A Depression, unspecified: Secondary | ICD-10-CM

## 2017-01-12 DIAGNOSIS — S0990XA Unspecified injury of head, initial encounter: Secondary | ICD-10-CM | POA: Diagnosis present

## 2017-01-12 DIAGNOSIS — Z87891 Personal history of nicotine dependence: Secondary | ICD-10-CM | POA: Diagnosis not present

## 2017-01-12 DIAGNOSIS — F329 Major depressive disorder, single episode, unspecified: Secondary | ICD-10-CM | POA: Diagnosis not present

## 2017-01-12 DIAGNOSIS — Z23 Encounter for immunization: Secondary | ICD-10-CM | POA: Diagnosis not present

## 2017-01-12 DIAGNOSIS — R45851 Suicidal ideations: Secondary | ICD-10-CM

## 2017-01-12 DIAGNOSIS — W19XXXA Unspecified fall, initial encounter: Secondary | ICD-10-CM

## 2017-01-12 LAB — CBC WITH DIFFERENTIAL/PLATELET
Basophils Absolute: 0.1 10*3/uL (ref 0.0–0.1)
Basophils Relative: 1 %
EOS ABS: 0.1 10*3/uL (ref 0.0–0.7)
EOS PCT: 1 %
HCT: 44.2 % (ref 39.0–52.0)
Hemoglobin: 15.6 g/dL (ref 13.0–17.0)
LYMPHS ABS: 2.1 10*3/uL (ref 0.7–4.0)
Lymphocytes Relative: 23 %
MCH: 31.7 pg (ref 26.0–34.0)
MCHC: 35.3 g/dL (ref 30.0–36.0)
MCV: 89.8 fL (ref 78.0–100.0)
MONO ABS: 0.7 10*3/uL (ref 0.1–1.0)
Monocytes Relative: 8 %
Neutro Abs: 6.4 10*3/uL (ref 1.7–7.7)
Neutrophils Relative %: 67 %
PLATELETS: 244 10*3/uL (ref 150–400)
RBC: 4.92 MIL/uL (ref 4.22–5.81)
RDW: 12.7 % (ref 11.5–15.5)
WBC: 9.4 10*3/uL (ref 4.0–10.5)

## 2017-01-12 LAB — COMPREHENSIVE METABOLIC PANEL
ALT: 97 U/L — AB (ref 17–63)
ANION GAP: 10 (ref 5–15)
AST: 120 U/L — ABNORMAL HIGH (ref 15–41)
Albumin: 4.7 g/dL (ref 3.5–5.0)
Alkaline Phosphatase: 66 U/L (ref 38–126)
BUN: 9 mg/dL (ref 6–20)
CHLORIDE: 104 mmol/L (ref 101–111)
CO2: 24 mmol/L (ref 22–32)
Calcium: 9 mg/dL (ref 8.9–10.3)
Creatinine, Ser: 0.68 mg/dL (ref 0.61–1.24)
Glucose, Bld: 94 mg/dL (ref 65–99)
POTASSIUM: 3.8 mmol/L (ref 3.5–5.1)
SODIUM: 138 mmol/L (ref 135–145)
Total Bilirubin: 0.6 mg/dL (ref 0.3–1.2)
Total Protein: 7.9 g/dL (ref 6.5–8.1)

## 2017-01-12 LAB — RAPID URINE DRUG SCREEN, HOSP PERFORMED
AMPHETAMINES: NOT DETECTED
BENZODIAZEPINES: NOT DETECTED
Barbiturates: NOT DETECTED
COCAINE: NOT DETECTED
OPIATES: NOT DETECTED
TETRAHYDROCANNABINOL: NOT DETECTED

## 2017-01-12 LAB — ETHANOL: Alcohol, Ethyl (B): 220 mg/dL — ABNORMAL HIGH (ref <5)

## 2017-01-12 MED ORDER — LIDOCAINE HCL (PF) 1 % IJ SOLN
INTRAMUSCULAR | Status: AC
  Administered 2017-01-12: 10 mL
  Filled 2017-01-12: qty 10

## 2017-01-12 MED ORDER — IBUPROFEN 400 MG PO TABS
600.0000 mg | ORAL_TABLET | Freq: Once | ORAL | Status: AC
  Administered 2017-01-12: 600 mg via ORAL
  Filled 2017-01-12: qty 2

## 2017-01-12 MED ORDER — LORAZEPAM 1 MG PO TABS
1.0000 mg | ORAL_TABLET | Freq: Three times a day (TID) | ORAL | Status: DC | PRN
  Administered 2017-01-12: 1 mg via ORAL
  Filled 2017-01-12: qty 1

## 2017-01-12 MED ORDER — LIDOCAINE HCL (PF) 1 % IJ SOLN
30.0000 mL | Freq: Once | INTRAMUSCULAR | Status: AC
  Administered 2017-01-12: 10 mL
  Filled 2017-01-12: qty 30

## 2017-01-12 MED ORDER — TETANUS-DIPHTH-ACELL PERTUSSIS 5-2.5-18.5 LF-MCG/0.5 IM SUSP
0.5000 mL | Freq: Once | INTRAMUSCULAR | Status: AC
  Administered 2017-01-12: 0.5 mL via INTRAMUSCULAR
  Filled 2017-01-12: qty 0.5

## 2017-01-12 NOTE — ED Notes (Signed)
Pt spouse came up to nurses station and reported that pt has been expressing thoughts of suicide to her and she would like him evaluated. Informed her that pt needed to express this to EDP when evaluated.

## 2017-01-12 NOTE — ED Notes (Signed)
Pt says his neck is just a little sore and does not want a CT scan.

## 2017-01-12 NOTE — Discharge Instructions (Signed)
Stitches out in 7 days. Follow up for the depression.

## 2017-01-12 NOTE — ED Provider Notes (Signed)
[facility] DEPT Provider Note   CSN: [id number] Arrival date & time: 01/12/17  0518     History   Chief Complaint Chief Complaint  Patient presents with  . Fall  . V70.1    HPI Harold Trujillo is a 56 y.o. male.  HPI Patient presents after a fall. He had about 14 beers tonight. Was out playing video games. He works third shift so is not unusual for him to be up this time a day. He then reportedly tripped and fell striking his left head. Has laceration to his forehead and his left lower eyelid. Unknown last shot. No headache. No loss conscious. No neck pain. Patient does state that he has some double vision now. Patient states he only drinks on the weekend. Denies other drug use. No localizing numbness or weakness. No neck pain.  Patient has however depressed. Reportedly history of depression and is on Paxil by his primary care doctor. States that he is now suicidal. Has had some worry that he would do something. Has a history of depression but no previous suicide attempt.  Past Medical History:  Diagnosis Date  . ADHD (attention deficit hyperactivity disorder)   . Anxiety   . Depression   . Insomnia   . Sleep apnea    no cpap use at this time    Patient Active Problem List   Diagnosis Date Noted  . Generalized anxiety disorder 12/12/2013  . Insomnia 12/12/2013  . Hyperlipidemia LDL goal <130 12/12/2013  . Obstructive sleep apnea 12/12/2013  . Low testosterone 12/12/2013    Past Surgical History:  Procedure Laterality Date  . CHOLECYSTECTOMY    . for arm surgery  forearm surgery L   . HERNIA REPAIR         Home Medications    Prior to Admission medications   Medication Sig Start Date End Date Taking? Authorizing Provider  ALPRAZolam Duanne Moron) 1 MG tablet TAKE ONE-HALF TO ONE TABLET AT BEDTIME 08/27/16  Yes Mikey Kirschner, MD  naproxen (NAPROSYN) 500 MG tablet Take 500 mg by mouth daily as needed.   Yes Historical Provider, MD  PARoxetine (PAXIL) 20 MG  tablet Take 1 tablet (20 mg total) by mouth daily. Patient taking differently: Take 10 mg by mouth daily.  08/27/16  Yes Mikey Kirschner, MD  sildenafil (REVATIO) 20 MG tablet Take 2 or 3 tablets prior to sex prn Patient not taking: Reported on 09/26/2016 08/27/16   Mikey Kirschner, MD    Family History Family History  Problem Relation Age of Onset  . Diabetes Other   . Colon cancer Neg Hx     Social History Social History  Substance Use Topics  . Smoking status: Former Research scientist (life sciences)  . Smokeless tobacco: Never Used  . Alcohol use 7.2 oz/week    12 Cans of beer per week     Comment: once a week 8-12 beers per day     Allergies   Shellfish allergy and Penicillins   Review of Systems Review of Systems  Constitutional: Positive for diaphoresis. Negative for fever.  HENT: Negative for congestion.   Eyes: Positive for visual disturbance.  Respiratory: Negative for chest tightness.   Cardiovascular: Negative for chest pain.  Gastrointestinal: Negative for abdominal distention.  Genitourinary: Negative for dysuria.  Musculoskeletal: Negative for back pain.  Neurological: Negative for light-headedness and headaches.  Psychiatric/Behavioral: Positive for dysphoric mood and suicidal ideas.     Physical Exam Updated Vital Signs BP 120/73 (BP Location: Left Arm)  Pulse 70   Temp 97.5 F (36.4 C) (Oral)   Resp 18   Ht 6\' 1"  (1.854 m)   Wt 291 lb (132 kg)   SpO2 98%   BMI 38.39 kg/m   Physical Exam  Constitutional: He appears well-developed and well-nourished.  HENT:  Approximately 2.5 centimeter curved laceration on left lower eyelid. Goes from the medial eyelid and curves up towards the lateral eyelid. Also has approximately 6 cm laceration on his upper forehead/scalp.  Eyes: EOM are normal. Pupils are equal, round, and reactive to light.  Eye movements are intact however he states he does have more double vision with gaze to the left. This has some nystagmus with gaze to  left. Eye movements appear conjugate  Neck: Neck supple.  Cardiovascular: Normal rate.   Pulmonary/Chest: Effort normal.  Abdominal: Soft.  Musculoskeletal: He exhibits no edema.  Neurological: He is alert.  Skin: Skin is warm. Capillary refill takes less than 2 seconds.  Psychiatric: He has a normal mood and affect.     ED Treatments / Results  Labs (all labs ordered are listed, but only abnormal results are displayed) Labs Reviewed  COMPREHENSIVE METABOLIC PANEL - Abnormal; Notable for the following:       Result Value   AST 120 (*)    ALT 97 (*)    All other components within normal limits  ETHANOL - Abnormal; Notable for the following:    Alcohol, Ethyl (B) 220 (*)    All other components within normal limits  CBC WITH DIFFERENTIAL/PLATELET  RAPID URINE DRUG SCREEN, HOSP PERFORMED    EKG  EKG Interpretation None       Radiology Ct Head Wo Contrast  Result Date: 01/12/2017 CLINICAL DATA:  Facial trauma after a fall.  History of ethanol use. EXAM: CT HEAD WITHOUT CONTRAST CT MAXILLOFACIAL WITHOUT CONTRAST TECHNIQUE: Multidetector CT imaging of the head and maxillofacial structures were performed using the standard protocol without intravenous contrast. Multiplanar CT image reconstructions of the maxillofacial structures were also generated. COMPARISON:  None. FINDINGS: CT HEAD FINDINGS Brain: No evidence for acute infarction, hemorrhage, mass lesion, hydrocephalus, or extra-axial fluid. Normal cerebral volume. No white matter disease. Vascular: No hyperdense vessel or unexpected calcification. Skull: The calvarium is intact. There is a LEFT frontal scalp hematoma and laceration, but no visible foreign body. Other: None. CT MAXILLOFACIAL FINDINGS Osseous: No acute facial fracture or blowout injury. No mandibular dislocation. There is slight flattening of the RIGHT nasal bone but this does not appear acute. Orbits: Negative. No traumatic or inflammatory finding. Sinuses: Chronic  mucosal thickening in the maxillary sinuses. Frontal sinuses are hypoplastic. Soft tissues: LEFT facial soft tissue swelling without visible foreign body. IMPRESSION: No skull fracture or intracranial hemorrhage. No acute facial fracture or blowout injury. LEFT facial and LEFT scalp soft tissue swelling, without foreign body. Electronically Signed   By: Staci Righter M.D.   On: 01/12/2017 08:13   Ct Cervical Spine Wo Contrast  Result Date: 01/12/2017 CLINICAL DATA:  Fall, posterior neck pain EXAM: CT CERVICAL SPINE WITHOUT CONTRAST TECHNIQUE: Multidetector CT imaging of the cervical spine was performed without intravenous contrast. Multiplanar CT image reconstructions were also generated. COMPARISON:  None. FINDINGS: Alignment: Normal cervical lordosis. Skull base and vertebrae: No acute fracture. No primary bone lesion or focal pathologic process. Well corticated lucency along the posterior spinous process at C7 (sagittal image 32), possibly reflecting congenital non fusion versus remote traumatic injury, not acute. Soft tissues and spinal canal: No prevertebral fluid  or swelling. No visible canal hematoma. Disc levels:  Mild degenerative changes at C6-7. Spinal canal is patent. Upper chest: Visualized lung apices are clear. Other: Visualized thyroid is unremarkable. IMPRESSION: No evidence of traumatic injury to the cervical spine. Electronically Signed   By: Julian Hy M.D.   On: 01/12/2017 11:32   Ct Maxillofacial Wo Contrast  Result Date: 01/12/2017 CLINICAL DATA:  Facial trauma after a fall.  History of ethanol use. EXAM: CT HEAD WITHOUT CONTRAST CT MAXILLOFACIAL WITHOUT CONTRAST TECHNIQUE: Multidetector CT imaging of the head and maxillofacial structures were performed using the standard protocol without intravenous contrast. Multiplanar CT image reconstructions of the maxillofacial structures were also generated. COMPARISON:  None. FINDINGS: CT HEAD FINDINGS Brain: No evidence for acute  infarction, hemorrhage, mass lesion, hydrocephalus, or extra-axial fluid. Normal cerebral volume. No white matter disease. Vascular: No hyperdense vessel or unexpected calcification. Skull: The calvarium is intact. There is a LEFT frontal scalp hematoma and laceration, but no visible foreign body. Other: None. CT MAXILLOFACIAL FINDINGS Osseous: No acute facial fracture or blowout injury. No mandibular dislocation. There is slight flattening of the RIGHT nasal bone but this does not appear acute. Orbits: Negative. No traumatic or inflammatory finding. Sinuses: Chronic mucosal thickening in the maxillary sinuses. Frontal sinuses are hypoplastic. Soft tissues: LEFT facial soft tissue swelling without visible foreign body. IMPRESSION: No skull fracture or intracranial hemorrhage. No acute facial fracture or blowout injury. LEFT facial and LEFT scalp soft tissue swelling, without foreign body. Electronically Signed   By: Staci Righter M.D.   On: 01/12/2017 08:13    Procedures Procedures (including critical care time)  Medications Ordered in ED Medications  LORazepam (ATIVAN) tablet 1 mg (1 mg Oral Given 01/12/17 1035)  Tdap (BOOSTRIX) injection 0.5 mL (0.5 mLs Intramuscular Given 01/12/17 0816)  lidocaine (PF) (XYLOCAINE) 1 % injection 30 mL (10 mLs Infiltration Given 01/12/17 0810)  ibuprofen (ADVIL,MOTRIN) tablet 600 mg (600 mg Oral Given 01/12/17 1044)     Initial Impression / Assessment and Plan / ED Course  I have reviewed the triage vital signs and the nursing notes.  Pertinent labs & imaging results that were available during my care of the patient were reviewed by me and considered in my medical decision making (see chart for details).     Patient presents after fall. Likely mechanical after he been drinking. Does have laceration to forehead and eyelid. Both were closed. Has double vision out of the left eye alone now. Pupil reactive and eye movements intact. Conjugate gaze. Discussed with Dr.  Ellie Lunch, who will have the patient see her partner Dr. Satira Sark 9:30 tomorrow morning. Head CT and facial CT reassuring, however began to have more severe neck pain. Cervical spine CT added. Patient is depressed with some suicidal thoughts. Will have TTS consult.  LACERATION REPAIR Performed by: Mackie Pai Authorized by: Mackie Pai Consent: Verbal consent obtained. Risks and benefits: risks, benefits and alternatives were discussed Consent given by: patient Patient identity confirmed: provided demographic data Prepped and Draped in normal sterile fashion Wound explored  Laceration Location: Left lower eyelid  Laceration Length: 2 cm  No Foreign Bodies seen or palpated  Anesthesia: local infiltration  Local anesthetic: lidocaine 1 %   Anesthetic total: 1 ml  Irrigation method: syringe Amount of cleaning: standard  Skin closure: 5-0 Vicryl Rapide   Number of sutures: 8 running sutures   Patient tolerance: Patient tolerated the procedure well with no immediate complications.  LACERATION REPAIR Performed by: Mackie Pai Authorized  by: Mackie Pai Consent: Verbal consent obtained. Risks and benefits: risks, benefits and alternatives were discussed Consent given by: patient Patient identity confirmed: provided demographic data Prepped and Draped in normal sterile fashion Wound explored  Laceration Location: Forehead  Laceration Length: 7 cm  No Foreign Bodies seen or palpated  Anesthesia: local infiltration  Local anesthetic: lidocaine 1 %   Anesthetic total: 3 ml  Irrigation method: syringe Amount of cleaning: standard  Skin closure: 5-0 Vicryl Rapide   Number of sutures: 8   Technique: Simple interrupted   Patient tolerance: Patient tolerated the procedure well with no immediate complications.   Patient presents seem by TTS who also consulted with the psychiatrist. Seem safe to go home with outpatient follow-up. Will  follow with ophthalmologist tomorrow. Sutures to be taken out in around a week. Discharge home.  Final Clinical Impressions(s) / ED Diagnoses   Final diagnoses:  Fall, initial encounter  Face lacerations, initial encounter  Strain of neck muscle, initial encounter  Depression, unspecified depression type  Suicidal ideation    New Prescriptions New Prescriptions   No medications on file     Davonna Belling, MD 01/12/17 1344

## 2017-01-12 NOTE — BH Assessment (Signed)
Tele Assessment Note   Harold Trujillo is a 56 y.o. male who presented to [facility] after consuming 12 beers and falling.  He was treated for head injury, and during the course of treatment, Pt expressed suicidal ideation without plan or intent.  Pt provided history.    Pt stated that he usually consumes 8-12 beers in one sitting once a week. Drinking eases stress.  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  He takes Paxil (prescribed by PCP) to "take the edge off."  Pt reported as follows:  He stated that he has been stressed, which is why he drinks, but he does not feel suicidal.  Pt denied any past suicide attempts, homicidal ideation, self-injury, or auditory/visual hallucination.  Pt expressed a desire to receive outpatient services for treatment of alcohol use.  When asked about suicidal statement, Pt reported, "That was just the alcohol talking."  Pt's wife was present, and she stated that she is comfortable with Pt returning home.  "I don't think he would kill himself."   During assessment, Pt presented as alert and oriented.  He had good eye contact and was cooperative in session.  Pt's mood was euthymic, and affect was pleasant.  Pt denied any suicidal ideation, and while he did not deny making a statement to the effect of "I wish I were dead," he denied current or past suicidal ideation, plan, or intent.  Pt endorsed ongoing sadness, "stress," disturbed sleep due to working third shift, and fatigue.  Pt also endorsed weekly binge use of alcohol --"I know it's not good for me."  Pt's speech was normal in rate, rhythm, and volume.  Pt's thought processes were within normal range; thought content was goal-oriented.  Pt's memory and concentration were intact.  Impulse control was poor.  Insight and judgment were fair.    Consulted w/Dr. Dwyane Dee who recommended discharge with outpatient resources -- Pt expressed an interest in having outpatient therapy, and he agreed to  contacting his EAP.  Diagnosis: Alcohol Use Disorder; MDD Recurrent, Moderate  Past Medical History:  Past Medical History:  Diagnosis Date  . ADHD (attention deficit hyperactivity disorder)   . Anxiety   . Depression   . Insomnia   . Sleep apnea    no cpap use at this time    Past Surgical History:  Procedure Laterality Date  . CHOLECYSTECTOMY    . for arm surgery  forearm surgery L   . HERNIA REPAIR      Family History:  Family History  Problem Relation Age of Onset  . Diabetes Other   . Colon cancer Neg Hx     Social History:  reports that he has quit smoking. He has never used smokeless tobacco. He reports that he drinks about 7.2 oz of alcohol per week . He reports that he does not use drugs.  Additional Social History:  Alcohol / Drug Use Pain Medications: See PTA Prescriptions: See PTA Over the Counter: See PTA History of alcohol / drug use?: Yes Substance #1 Name of Substance 1: Alcohol 1 - Amount (size/oz): 8-12 beers 1 - Frequency: Weekly 1 - Duration: Ongoing 1 - Last Use / Amount: 01/11/17  CIWA: CIWA-Ar BP: 124/72 Pulse Rate: 72 COWS:    PATIENT STRENGTHS: (choose at least two) Average or above average intelligence Capable of independent living  Allergies:  Allergies  Allergen Reactions  . Shellfish Allergy Nausea And Vomiting  . Penicillins Other (See Comments)  Mom said I had reaction as a baby-per pt Has patient had a PCN reaction causing immediate rash, facial/tongue/throat swelling, SOB or lightheadedness with hypotension: unknown Has patient had a PCN reaction causing severe rash involving mucus membranes or skin necrosis:unknown Has patient had a PCN reaction that required hospitalization? unknown Has patient had a PCN reaction occurring within the last 10 years: No If all of the above answers are "NO", then may proceed with Cephalosporin use.     Home Medications:  (Not in a hospital admission)  OB/GYN Status:  No LMP for male  patient.  General Assessment Data Location of Assessment: [facility] T[facility] Assessment: In system Is this a Tele or Face-to-Face Assessment?: Tele Assessment Is this an Initial Assessment or a Re-assessment for this encounter?: Initial Assessment Marital status: Married Living Arrangements: Spouse/significant other Can pt return to current living arrangement?: Yes Admission Status: Voluntary Is patient capable of signing voluntary admission?: Yes Referral Source: Self/Family/Friend Insurance type: [place] Living Arrangements: Spouse/significant other Name of Psychiatrist: None currently Name of Therapist: None currently  Education Status Is patient currently in school?: No  Risk to self with the past 6 months Suicidal Ideation: No Has patient been a risk to self within the past 6 months prior to admission? : No Suicidal Intent: No Has patient had any suicidal intent within the past 6 months prior to admission? : No Is patient at risk for suicide?: No Suicidal Plan?: No Has patient had any suicidal plan within the past 6 months prior to admission? : No Access to Means: No What has been your use of drugs/alcohol within the last 12 months?: Alcohol use Previous Attempts/Gestures: No Intentional Self Injurious Behavior: None Family Suicide History: No Recent stressful life event(s): Other (Comment) (Work stress -- working 3rd shift) Persecutory voices/beliefs?: No Depression: Yes Depression Symptoms: Insomnia, Despondent Substance abuse history and/or treatment for substance abuse?: No Suicide prevention information given to non-admitted patients: Not applicable  Risk to Others within the past 6 months Homicidal Ideation: No Does patient have any lifetime risk of violence toward others beyond the six months prior to admission? : No Thoughts of Harm to Others: No Current Homicidal Intent: No Current Homicidal Plan: No Access to Homicidal Means: No History of  harm to others?: No Assessment of Violence: None Noted Does patient have access to weapons?: No Criminal Charges Pending?: No Does patient have a court date: No Is patient on probation?: No  Psychosis Hallucinations: None noted Delusions: None noted  Mental Status Report Appearance/Hygiene: In scrubs Eye Contact: Good Motor Activity: Unremarkable, Freedom of movement Speech: Unremarkable, Logical/coherent Level of Consciousness: Alert Mood: Pleasant Affect: Appropriate to circumstance Anxiety Level: None Thought Processes: Coherent, Relevant Judgement: Partial Orientation: Person, Place, Time, Situation Obsessive Compulsive Thoughts/Behaviors: None  Cognitive Functioning Concentration: Normal Memory: Recent Intact, Remote Intact IQ: Average Insight: Fair Impulse Control: Poor Appetite: Good Sleep: Decreased Vegetative Symptoms: None  ADLScreening [facility] Assessment Services) Patient's cognitive ability adequate to safely complete daily activities?: Yes Patient able to express need for assistance with ADLs?: Yes Independently performs ADLs?: Yes (appropriate for developmental age)  Prior Inpatient Therapy Prior Inpatient Therapy: No  Prior Outpatient Therapy Prior Outpatient Therapy: No Does patient have an ACCT team?: No Does patient have Intensive In-House Services?  : No Does patient have [facility] services? : No Does patient have P4CC services?: No  ADL Screening (condition at time of admission) Patient's cognitive ability adequate to safely complete daily activities?: Yes  Is the patient deaf or have difficulty hearing?: No Does the patient have difficulty seeing, even when wearing glasses/contacts?: No Does the patient have difficulty concentrating, remembering, or making decisions?: No Patient able to express need for assistance with ADLs?: Yes Does the patient have difficulty dressing or bathing?: No Independently performs ADLs?: Yes (appropriate for  developmental age) Does the patient have difficulty walking or climbing stairs?: No Weakness of Legs: None Weakness of Arms/Hands: None  Home Assistive Devices/Equipment Home Assistive Devices/Equipment: None  Therapy Consults (therapy consults require a physician order) PT Evaluation Needed: No OT Evalulation Needed: No SLP Evaluation Needed: No Abuse/Neglect Assessment (Assessment to be complete while patient is alone) Physical Abuse: Denies Verbal Abuse: Denies Sexual Abuse: Denies Exploitation of patient/patient's resources: Denies Self-Neglect: Denies Values / Beliefs Cultural Requests During Hospitalization: None Spiritual Requests During Hospitalization: None Consults Spiritual Care Consult Needed: No Social Work Consult Needed: No Regulatory affairs officer (For Healthcare) Does Patient Have a Medical Advance Directive?: No    Additional Information 1:1 In Past 12 Months?: No CIRT Risk: No Elopement Risk: No Does patient have medical clearance?: Yes     Disposition:  Disposition Initial Assessment Completed for this Encounter: Yes Disposition of Patient: Referred to Patient referred to: Other (Comment) (O/P EAP)  Laurena Slimmer Onica Davidovich 01/12/2017 11:55 AM

## 2017-01-12 NOTE — ED Triage Notes (Signed)
Pt admits to etoh use, states that he has had at least 14 beers tonight, fell hitting his head, pt has laceration to forehead area and under left eye,

## 2017-01-17 ENCOUNTER — Encounter: Payer: Self-pay | Admitting: Family Medicine

## 2017-01-17 ENCOUNTER — Ambulatory Visit (INDEPENDENT_AMBULATORY_CARE_PROVIDER_SITE_OTHER): Payer: BLUE CROSS/BLUE SHIELD | Admitting: Family Medicine

## 2017-01-17 VITALS — BP 128/84 | Ht 73.0 in | Wt 286.0 lb

## 2017-01-17 DIAGNOSIS — F1010 Alcohol abuse, uncomplicated: Secondary | ICD-10-CM

## 2017-01-17 DIAGNOSIS — F411 Generalized anxiety disorder: Secondary | ICD-10-CM | POA: Diagnosis not present

## 2017-01-17 DIAGNOSIS — F5101 Primary insomnia: Secondary | ICD-10-CM | POA: Diagnosis not present

## 2017-01-17 MED ORDER — ALPRAZOLAM 1 MG PO TABS: ORAL_TABLET | ORAL | 5 refills | Status: DC

## 2017-01-17 MED ORDER — PAROXETINE HCL 20 MG PO TABS: 20.0000 mg | ORAL_TABLET | Freq: Every day | ORAL | 1 refills | Status: DC

## 2017-01-17 NOTE — Progress Notes (Signed)
   Subjective:    Patient ID: Harold Trujillo, male    DOB: [birth year], 56 y.o.   MRN: [id number]  [place] and hit head on 2/11. Went to ED. Had sutures put in on forehead and left lower eyelid.   Patient read arrives office for supposedly just a suture removal, howeve  more substantial issues definitely going on which patient needs to discuss  Full emergency room report reviewed. Full imaging studies observed and reviewed with patient. Next  Patient fell struck his head after by his own admission getting very drunk drinking many beers. Patient realizes he has a binge alcohol problem. Next  In the emergency room patient claimed ideation of "not wanting to be here anymore", but today he is adamantly against a thought that this was suicidal thoughts. He claims this was a beer talking. He basically refuses to go see a psychiatrist at this point. He states he actually is not significantly depressed. Next  Patient reports ongoing challenges with anxiety. Next  Patient also very resistant to the idea of alcohol rehabilitation referral. But he is willingly going to a counselor at this time. Review of Systems No headache, no major weight loss or weight gain, no chest pain no back pain abdominal pain no change in bowel habits complete ROS otherwise negative     Objective:   Physical Exam Alert vitals stable, NAD. Blood pressure good on repeat. HEENT normal. Lungs clear. Heart regular rate and rhythm. Forehead sutures removed. Sutures near the eye were put in with flesh-colored very tiny running cuticular sutures which I simply not able to do without a slit lamp and/or surgical loupe discussed with patient      Assessment & Plan:  Impression 1 alcohol binging with patient resistant to formal alcohol referral program #2 chronic anxiety with adamant statement that patient is not depressed. Of note was assessed by psychiatric team at the emergency room who also felt patient did not have acute danger  to himself or others. They did encourage counseling which the patient is pursuing. #3 suture removal #4 periocular laceration with patient needing to get back to his eye doctor to discuss plan 40 minutes spent with patient most in discussion regarding patient's complicated issues above. Paxil and Xanax refilled proper use discussed warning signs discussed carefully strongly encouraged to stop binge drinking

## 2017-01-18 ENCOUNTER — Encounter: Payer: Self-pay | Admitting: Family Medicine

## 2017-01-20 ENCOUNTER — Telehealth: Payer: Self-pay | Admitting: *Deleted

## 2017-01-20 NOTE — Telephone Encounter (Signed)
In my experience flesh colored sutures are only put in by those who never take them out  ----- Message -----  From: Carmelina Noun, LPN  Sent: [id number]  8:20 AM  To: Mikey Kirschner, MD  Subject: FW: Non-Urgent Medical Question               ----- Message -----  From: Rossie Muskrat  Sent: 01/18/2017  2:24 PM  To: [facility] Clinical Pool  Subject: Non-Urgent Medical Question             Left 2 stiches on forehead. Want a refund lol   Called pt. He is on his way to a specialist office to have the sutures on eye lid removed and will ask the specialist to remove the two sutures on forehead as well.

## 2017-02-07 ENCOUNTER — Telehealth: Payer: BLUE CROSS/BLUE SHIELD | Admitting: Family

## 2017-02-07 DIAGNOSIS — J111 Influenza due to unidentified influenza virus with other respiratory manifestations: Secondary | ICD-10-CM

## 2017-02-07 MED ORDER — OSELTAMIVIR PHOSPHATE 75 MG PO CAPS: 75.0000 mg | ORAL_CAPSULE | Freq: Two times a day (BID) | ORAL | 0 refills | Status: DC

## 2017-02-07 NOTE — Progress Notes (Signed)

## 2017-02-09 ENCOUNTER — Encounter: Payer: Self-pay | Admitting: Physician Assistant

## 2017-03-03 ENCOUNTER — Encounter: Payer: Self-pay | Admitting: Family Medicine

## 2017-03-04 ENCOUNTER — Telehealth: Payer: Self-pay | Admitting: Family Medicine

## 2017-03-04 NOTE — Telephone Encounter (Signed)
Pt dropped off lab results that he had done through his employer. In office in blue folder.

## 2017-05-21 ENCOUNTER — Encounter: Payer: Self-pay | Admitting: Family Medicine

## 2017-07-18 ENCOUNTER — Ambulatory Visit: Payer: BLUE CROSS/BLUE SHIELD | Admitting: Family Medicine

## 2017-08-08 ENCOUNTER — Other Ambulatory Visit: Payer: Self-pay | Admitting: Family Medicine

## 2017-08-08 NOTE — Telephone Encounter (Signed)
May have 30 day prescription, needs office visit

## 2017-08-08 NOTE — Telephone Encounter (Signed)
Last seen February 2018

## 2017-08-18 ENCOUNTER — Encounter: Payer: Self-pay | Admitting: Family Medicine

## 2017-08-27 ENCOUNTER — Ambulatory Visit: Payer: BLUE CROSS/BLUE SHIELD | Admitting: Family Medicine

## 2017-09-08 ENCOUNTER — Encounter: Payer: Self-pay | Admitting: Family Medicine

## 2017-09-09 ENCOUNTER — Telehealth: Payer: Self-pay | Admitting: Family Medicine

## 2017-09-09 DIAGNOSIS — Z13220 Encounter for screening for lipoid disorders: Secondary | ICD-10-CM

## 2017-09-09 DIAGNOSIS — Z1329 Encounter for screening for other suspected endocrine disorder: Secondary | ICD-10-CM

## 2017-09-09 DIAGNOSIS — Z79899 Other long term (current) drug therapy: Secondary | ICD-10-CM

## 2017-09-09 DIAGNOSIS — Z125 Encounter for screening for malignant neoplasm of prostate: Secondary | ICD-10-CM

## 2017-09-09 DIAGNOSIS — E291 Testicular hypofunction: Secondary | ICD-10-CM

## 2017-09-09 DIAGNOSIS — Z1271 Encounter for screening for malignant neoplasm of testis: Secondary | ICD-10-CM

## 2017-09-09 NOTE — Telephone Encounter (Signed)
Patient has an appointment on 09/18/17 with Dr. Richardson Landry.  Requesting labs before hand.  Specifically, Testosterone and thyroid.

## 2017-09-09 NOTE — Telephone Encounter (Signed)
Lip liv m7 tsh testosterone

## 2017-09-10 NOTE — Telephone Encounter (Signed)
Patient is aware 

## 2017-09-12 LAB — HEPATIC FUNCTION PANEL
ALBUMIN: 4.9 g/dL (ref 3.5–5.5)
ALK PHOS: 69 IU/L (ref 39–117)
ALT: 55 IU/L — ABNORMAL HIGH (ref 0–44)
AST: 46 IU/L — ABNORMAL HIGH (ref 0–40)
BILIRUBIN, DIRECT: 0.14 mg/dL (ref 0.00–0.40)
Bilirubin Total: 0.6 mg/dL (ref 0.0–1.2)
TOTAL PROTEIN: 7.3 g/dL (ref 6.0–8.5)

## 2017-09-12 LAB — BASIC METABOLIC PANEL
BUN / CREAT RATIO: 14 (ref 9–20)
BUN: 14 mg/dL (ref 6–24)
CHLORIDE: 97 mmol/L (ref 96–106)
CO2: 22 mmol/L (ref 20–29)
CREATININE: 1.03 mg/dL (ref 0.76–1.27)
Calcium: 10 mg/dL (ref 8.7–10.2)
GFR, EST AFRICAN AMERICAN: 95 mL/min/{1.73_m2} (ref >59)
GFR, EST NON AFRICAN AMERICAN: 82 mL/min/{1.73_m2} (ref >59)
Glucose: 111 mg/dL — ABNORMAL HIGH (ref 65–99)
POTASSIUM: 4.4 mmol/L (ref 3.5–5.2)
SODIUM: 136 mmol/L (ref 134–144)

## 2017-09-12 LAB — LIPID PANEL
Chol/HDL Ratio: 5.5 ratio — ABNORMAL HIGH (ref 0.0–5.0)
Cholesterol, Total: 220 mg/dL — ABNORMAL HIGH (ref 100–199)
HDL: 40 mg/dL (ref >39)
LDL Calculated: 133 mg/dL — ABNORMAL HIGH (ref 0–99)
Triglycerides: 236 mg/dL — ABNORMAL HIGH (ref 0–149)
VLDL Cholesterol Cal: 47 mg/dL — ABNORMAL HIGH (ref 5–40)

## 2017-09-12 LAB — TSH: TSH: 2.34 u[IU]/mL (ref 0.450–4.500)

## 2017-09-12 LAB — TESTOSTERONE: Testosterone: 221 ng/dL — ABNORMAL LOW (ref 264–916)

## 2017-09-18 ENCOUNTER — Ambulatory Visit (INDEPENDENT_AMBULATORY_CARE_PROVIDER_SITE_OTHER): Payer: BLUE CROSS/BLUE SHIELD | Admitting: Family Medicine

## 2017-09-18 ENCOUNTER — Encounter: Payer: Self-pay | Admitting: Family Medicine

## 2017-09-18 VITALS — BP 122/86 | Ht 73.0 in | Wt 295.0 lb

## 2017-09-18 DIAGNOSIS — R7303 Prediabetes: Secondary | ICD-10-CM | POA: Insufficient documentation

## 2017-09-18 DIAGNOSIS — R748 Abnormal levels of other serum enzymes: Secondary | ICD-10-CM | POA: Diagnosis not present

## 2017-09-18 DIAGNOSIS — F5101 Primary insomnia: Secondary | ICD-10-CM

## 2017-09-18 DIAGNOSIS — Z23 Encounter for immunization: Secondary | ICD-10-CM | POA: Diagnosis not present

## 2017-09-18 DIAGNOSIS — F411 Generalized anxiety disorder: Secondary | ICD-10-CM

## 2017-09-18 MED ORDER — ALPRAZOLAM 1 MG PO TABS: ORAL_TABLET | ORAL | 5 refills | Status: DC

## 2017-09-18 MED ORDER — BUPROPION HCL ER (SR) 150 MG PO TB12: ORAL_TABLET | ORAL | 6 refills | Status: DC

## 2017-09-18 NOTE — Progress Notes (Signed)
Subjective:    Patient ID: Harold Trujillo, male    DOB: Nov 15, 1961, 56 y.o.   MRN: [id number] Patient arrives with numerous concerns HPI Patient is here today to follow up on anxiety. He is now on xanax 1 mg QHS to get to sleep. He reports his anxiety is much better.   He would like a flu shot today. He filled out GAD today.  Results for orders placed or performed in visit on 09/09/17  Lipid panel  Result Value Ref Range   Cholesterol, Total 220 (H) 100 - 199 mg/dL   Triglycerides 236 (H) 0 - 149 mg/dL   HDL 40 >39 mg/dL   VLDL Cholesterol Cal 47 (H) 5 - 40 mg/dL   LDL Calculated 133 (H) 0 - 99 mg/dL   Chol/HDL Ratio 5.5 (H) 0.0 - 5.0 ratio  Hepatic function panel  Result Value Ref Range   Total Protein 7.3 6.0 - 8.5 g/dL   Albumin 4.9 3.5 - 5.5 g/dL   Bilirubin Total 0.6 0.0 - 1.2 mg/dL   Bilirubin, Direct 0.14 0.00 - 0.40 mg/dL   Alkaline Phosphatase 69 39 - 117 IU/L   AST 46 (H) 0 - 40 IU/L   ALT 55 (H) 0 - 44 IU/L  Basic metabolic panel  Result Value Ref Range   Glucose 111 (H) 65 - 99 mg/dL   BUN 14 6 - 24 mg/dL   Creatinine, Ser 1.03 0.76 - 1.27 mg/dL   GFR calc non Af Amer 82 >59 mL/min/1.73   GFR calc Af Amer 95 >59 mL/min/1.73   BUN/Creatinine Ratio 14 9 - 20   Sodium 136 134 - 144 mmol/L   Potassium 4.4 3.5 - 5.2 mmol/L   Chloride 97 96 - 106 mmol/L   CO2 22 20 - 29 mmol/L   Calcium 10.0 8.7 - 10.2 mg/dL  TSH  Result Value Ref Range   TSH 2.340 0.450 - 4.500 uIU/mL  Testosterone  Result Value Ref Range   Testosterone 221 (L) 264 - 916 ng/dL   Alcohol intake stopped  Pt wants to lose weight  Felt better on testosterone, handled well,  gen viagra not working  Test helps situation   Does nto drink glucose any, mo and g mo and g fa has diabetes,  Exercise three d per wk, works out well   Paxil helped some as far as anxiety. Patient would like to try Wellbutrin. He hurt it might help for his weight. No historyof seizure disorder  Ongoing use of  Xanax anight for slee.  Patient compliant with insomnia medication. Generally takes most nights. No obvious morning drowsiness. Definitely helps patient sleep. Without it patient states would not get a good nights rest.        Review of Systems No headache, no major weight loss or weight gain, no chest pain no back pain abdominal pain no change in bowel habits complete ROS otherwise negative     Objective:   Physical Exam  Alert and oriented, vitals reviewed and stable, NAD ENT-TM's and ext canals WNL bilat via otoscopic exam Soft palate, tonsils and post pharynx WNL via oropharyngeal exam Neck-symmetric, no masses; thyroid nonpalpable and nontender Pulmonary-no tachypnea or accessory muscle use; Clear without wheezes via auscultation Card--no abnrml murmurs, rhythm reg and rate WNL Carotid pulses symmetric, without bruits       Assessment & Plan:  Impression #1 prediabetes discussed at length. Weight loss discussed and strongly encourage  #2 elevated liver enzymes. Time for some  testing. Discussed with patient will do ultrasound was blood work  #3 chronic anxietyongoing significant issue. We'll switch to Wellbutrin. Maintain Xanax.  #4 alcohol abuse patient states improved  #5 obesity discussed  #6 mild low testosterone. Patient definitely in my view not a candidate for supplementation. Discussed. He states he may search elsewhere for testosterone Flu shot today. Greater than 50% of this 25 minute face to face visit was spent in counseling and discussion and coordination of care regarding the above diagnosis/diagnosies

## 2017-09-19 ENCOUNTER — Telehealth: Payer: Self-pay | Admitting: Family Medicine

## 2017-09-19 ENCOUNTER — Encounter: Payer: Self-pay | Admitting: Family Medicine

## 2017-09-19 DIAGNOSIS — R634 Abnormal weight loss: Secondary | ICD-10-CM

## 2017-09-19 NOTE — Telephone Encounter (Signed)
Sure,

## 2017-09-19 NOTE — Telephone Encounter (Signed)
Patient wanting to know if its ok that he goes to Dr. Anastasio Champion for his weight loss program only. He states that you discuss him losing weight at his recent appointment. Doesn't want to leave practice just needing help losing weight  and this doctor has this program.

## 2017-09-19 NOTE — Telephone Encounter (Signed)
Spoke with patient and informed him per Dr.Steve Luking- may have referral to Dr.Gosrani. Patient verbalized understanding. (lab orders)

## 2017-09-22 MED ORDER — IBUPROFEN 800 MG PO TABS: 800.0000 mg | ORAL_TABLET | Freq: Three times a day (TID) | ORAL | 2 refills | Status: DC | PRN

## 2017-09-29 ENCOUNTER — Ambulatory Visit (HOSPITAL_COMMUNITY): Admission: RE | Admit: 2017-09-29 | Payer: BLUE CROSS/BLUE SHIELD | Source: Ambulatory Visit

## 2017-10-06 ENCOUNTER — Ambulatory Visit (HOSPITAL_COMMUNITY)
Admission: RE | Admit: 2017-10-06 | Discharge: 2017-10-06 | Disposition: A | Discharge status: Home / Self Care | Payer: 59 | Source: Ambulatory Visit | Attending: Family Medicine | Admitting: Family Medicine

## 2017-10-06 DIAGNOSIS — R932 Abnormal findings on diagnostic imaging of liver and biliary tract: Secondary | ICD-10-CM | POA: Diagnosis not present

## 2017-10-06 DIAGNOSIS — Z0000 Encounter for general adult medical examination without abnormal findings: Secondary | ICD-10-CM | POA: Diagnosis not present

## 2017-10-06 DIAGNOSIS — E291 Testicular hypofunction: Secondary | ICD-10-CM | POA: Diagnosis not present

## 2017-10-06 DIAGNOSIS — R748 Abnormal levels of other serum enzymes: Secondary | ICD-10-CM | POA: Diagnosis present

## 2017-10-06 DIAGNOSIS — R5383 Other fatigue: Secondary | ICD-10-CM | POA: Diagnosis not present

## 2017-10-06 DIAGNOSIS — Z0389 Encounter for observation for other suspected diseases and conditions ruled out: Secondary | ICD-10-CM | POA: Diagnosis not present

## 2017-10-22 DIAGNOSIS — K7581 Nonalcoholic steatohepatitis (NASH): Secondary | ICD-10-CM | POA: Diagnosis not present

## 2017-10-22 DIAGNOSIS — R5383 Other fatigue: Secondary | ICD-10-CM | POA: Diagnosis not present

## 2017-10-22 DIAGNOSIS — Z0389 Encounter for observation for other suspected diseases and conditions ruled out: Secondary | ICD-10-CM | POA: Diagnosis not present

## 2017-10-27 DIAGNOSIS — E291 Testicular hypofunction: Secondary | ICD-10-CM | POA: Diagnosis not present

## 2017-10-27 DIAGNOSIS — R945 Abnormal results of liver function studies: Secondary | ICD-10-CM | POA: Diagnosis not present

## 2017-11-04 DIAGNOSIS — G4733 Obstructive sleep apnea (adult) (pediatric): Secondary | ICD-10-CM | POA: Diagnosis not present

## 2017-11-21 ENCOUNTER — Other Ambulatory Visit: Payer: Self-pay | Admitting: Family Medicine

## 2017-12-16 DIAGNOSIS — R5383 Other fatigue: Secondary | ICD-10-CM | POA: Diagnosis not present

## 2017-12-16 DIAGNOSIS — E291 Testicular hypofunction: Secondary | ICD-10-CM | POA: Diagnosis not present

## 2017-12-16 DIAGNOSIS — E785 Hyperlipidemia, unspecified: Secondary | ICD-10-CM | POA: Diagnosis not present

## 2018-01-20 DIAGNOSIS — E559 Vitamin D deficiency, unspecified: Secondary | ICD-10-CM | POA: Diagnosis not present

## 2018-01-20 DIAGNOSIS — E291 Testicular hypofunction: Secondary | ICD-10-CM | POA: Diagnosis not present

## 2018-02-06 ENCOUNTER — Telehealth: Payer: Self-pay | Admitting: Family Medicine

## 2018-02-06 MED ORDER — TADALAFIL 20 MG PO TABS: ORAL_TABLET | ORAL | 11 refills | Status: DC

## 2018-02-06 NOTE — Telephone Encounter (Signed)
Pt would like to change ED med to Cialis  States previous med didn't work well  Please send to [facility] - pt willing to pay out of pocket & understands insurance probably won't cover  Please call pt when done

## 2018-02-06 NOTE — Telephone Encounter (Signed)
Patient is aware medication sent in. 

## 2018-02-06 NOTE — Telephone Encounter (Signed)
Please advise 

## 2018-02-06 NOTE — Telephone Encounter (Signed)
cialis 20 numb six one po two thr prior to sex ref 53

## 2018-02-16 DIAGNOSIS — E785 Hyperlipidemia, unspecified: Secondary | ICD-10-CM | POA: Diagnosis not present

## 2018-02-16 DIAGNOSIS — R5383 Other fatigue: Secondary | ICD-10-CM | POA: Diagnosis not present

## 2018-02-16 DIAGNOSIS — E291 Testicular hypofunction: Secondary | ICD-10-CM | POA: Diagnosis not present

## 2018-03-04 ENCOUNTER — Telehealth: Payer: Self-pay | Admitting: Family Medicine

## 2018-03-04 NOTE — Telephone Encounter (Signed)
Discussed with patient He needs replacement Auto CPAP & supplies Would like order to go to [facility]  Explained to pt need documentation in OV to get replacement CPAP covered with insurance  Pt states he will discuss usage, benefit, & age of machine with Dr. Richardson Landry at his 03/17/18 OV with Dr. Richardson Landry  Once OV note is done will send order with all necessary documentation to [facility]

## 2018-03-04 NOTE — Telephone Encounter (Signed)
Patient is requesting new Rx sent to [facility] for c-pap machine and supplies.

## 2018-03-17 ENCOUNTER — Ambulatory Visit: Payer: 59 | Admitting: Family Medicine

## 2018-03-17 ENCOUNTER — Encounter: Payer: Self-pay | Admitting: Family Medicine

## 2018-03-17 VITALS — BP 128/90 | Ht 73.0 in | Wt 303.0 lb

## 2018-03-17 DIAGNOSIS — G4733 Obstructive sleep apnea (adult) (pediatric): Secondary | ICD-10-CM

## 2018-03-17 DIAGNOSIS — R7303 Prediabetes: Secondary | ICD-10-CM | POA: Diagnosis not present

## 2018-03-17 DIAGNOSIS — F5101 Primary insomnia: Secondary | ICD-10-CM | POA: Diagnosis not present

## 2018-03-17 DIAGNOSIS — F411 Generalized anxiety disorder: Secondary | ICD-10-CM | POA: Diagnosis not present

## 2018-03-17 DIAGNOSIS — E785 Hyperlipidemia, unspecified: Secondary | ICD-10-CM

## 2018-03-17 MED ORDER — BUPROPION HCL ER (SR) 150 MG PO TB12: ORAL_TABLET | ORAL | 5 refills | Status: DC

## 2018-03-17 NOTE — Progress Notes (Signed)
   Subjective:    Patient ID: Harold Trujillo, male    DOB: May 19, 1961, 57 y.o.   MRN: [id number] Patient presents with numerous concerns HPI Patient is here today to address his chronic health issues.He is states he is on Paxil 20 mg 1/2 days per week.Filling out Gad 7 at today visit.  Pt has  Ongoing anxiety issues   He also wants to discuss cpap machine use.    Pt need new cpap, curent on six yrs old.  Using nasal prong that is doable. Pt gets benefit from it, defiinitely helps  Pt on testerone, seeing hysician once per nonth or so.  Compliant with this.  May be helping       Wellbutrin is listed on med list and he states he is no longer taking it, It was replaced with Paxil he states.  On further discussion he would like to try Wellbutrin.   Review of Systems No headache, no major weight loss or weight gain, no chest pain no back pain abdominal pain no change in bowel habits complete ROS otherwise negative     Objective:   Physical Exam  Alert and oriented, vitals reviewed and stable, NAD ENT-TM's and ext canals WNL bilat via otoscopic exam Soft palate, tonsils and post pharynx WNL via oropharyngeal exam Neck-symmetric, no masses; thyroid nonpalpable and nontender Pulmonary-no tachypnea or accessory muscle use; Clear without wheezes via auscultation Card--no abnrml murmurs, rhythm reg and rate WNL Carotid pulses symmetric, without bruits       Assessment & Plan:  #1 obstructive sleep apnea.  Long-standing in nature.  Time for another unit.  This was at least 56 years old.  We will part.  Appropriate  2.  Insomnia.  Ongoing.  Uses Xanax  3.  Generalized anxiety disorder with depression discussed to utilize Wellbutrin.  Diet exercise discussed.  Medications refilled.  Prescription for sleep apnea.  Will forthcoming will review old records.  Review refills in 2003 and a split study.  Fairly severe sleep apnea at that time.  With 50 episodes per hour  Greater than  50% of this 25 minute face to face visit was spent in counseling and discussion and coordination of care regarding the above diagnosis/diagnosies

## 2018-03-31 ENCOUNTER — Other Ambulatory Visit: Payer: Self-pay | Admitting: Family Medicine

## 2018-03-31 NOTE — Telephone Encounter (Signed)
Ok plus five monthly ref 

## 2018-04-03 ENCOUNTER — Telehealth: Payer: Self-pay | Admitting: Family Medicine

## 2018-04-03 NOTE — Telephone Encounter (Signed)
Sent Rx order to Dr. Richardson Landry to sign, see phone message 04/03/18

## 2018-04-03 NOTE — Telephone Encounter (Signed)
Please sign order for replacement Auto CPAP so that I may fax with all required documentation   In green folder in yellow box

## 2018-04-06 NOTE — Telephone Encounter (Signed)
Called notified pt

## 2018-04-06 NOTE — Telephone Encounter (Signed)
Received signed order from Dr. Karie Mainland to [facility] with all required documentation   Replacement Auto CPAP with heated humidifier, mask/nasal pillows of choice, tubing, filter, & supplies  Pressure 4-20 cmH2O  Faxed original sleep study  Filed

## 2018-04-15 DIAGNOSIS — R5383 Other fatigue: Secondary | ICD-10-CM | POA: Diagnosis not present

## 2018-04-15 DIAGNOSIS — E291 Testicular hypofunction: Secondary | ICD-10-CM | POA: Diagnosis not present

## 2018-04-15 DIAGNOSIS — E785 Hyperlipidemia, unspecified: Secondary | ICD-10-CM | POA: Diagnosis not present

## 2018-04-22 ENCOUNTER — Telehealth: Payer: Self-pay | Admitting: *Deleted

## 2018-04-22 MED ORDER — SCOPOLAMINE 1 MG/3DAYS TD PT72: MEDICATED_PATCH | TRANSDERMAL | 0 refills | Status: DC

## 2018-04-22 NOTE — Telephone Encounter (Signed)
Same as spouse

## 2018-04-22 NOTE — Telephone Encounter (Signed)
Sent patches in to [facility] in [place]; left message on spouse voicemail to return call

## 2018-04-22 NOTE — Telephone Encounter (Signed)
Patient's wife called stating they will leave for a cruise tonight and patient needs a prescription for a patch to go behind his ear for motion sickness, Please advise    [facility] in [place] per wife's request.

## 2018-05-27 ENCOUNTER — Telehealth: Payer: Self-pay | Admitting: Family Medicine

## 2018-05-27 ENCOUNTER — Other Ambulatory Visit: Payer: Self-pay | Admitting: Family Medicine

## 2018-05-27 MED ORDER — ETODOLAC 400 MG PO TABS: ORAL_TABLET | ORAL | 0 refills | Status: DC

## 2018-05-27 NOTE — Telephone Encounter (Signed)
Patient said he mentioned at his last appointment about having shoulder pain.  He declined getting an injection at the time but now is hoping that he can get an antiinflammatory called in to [facility].

## 2018-05-27 NOTE — Telephone Encounter (Signed)
lodine 400 28 one p o bid with food prn pain

## 2018-05-27 NOTE — Telephone Encounter (Signed)
Med to pharmacy and patient notified

## 2018-06-12 DIAGNOSIS — B078 Other viral warts: Secondary | ICD-10-CM | POA: Diagnosis not present

## 2018-06-12 DIAGNOSIS — S20362A Insect bite (nonvenomous) of left front wall of thorax, initial encounter: Secondary | ICD-10-CM | POA: Diagnosis not present

## 2018-06-19 ENCOUNTER — Encounter: Payer: Self-pay | Admitting: Family Medicine

## 2018-06-19 ENCOUNTER — Ambulatory Visit: Payer: 59 | Admitting: Family Medicine

## 2018-06-19 VITALS — BP 126/82 | Temp 97.9°F | Ht 73.0 in | Wt 291.6 lb

## 2018-06-19 DIAGNOSIS — J019 Acute sinusitis, unspecified: Secondary | ICD-10-CM | POA: Diagnosis not present

## 2018-06-19 DIAGNOSIS — M25511 Pain in right shoulder: Secondary | ICD-10-CM

## 2018-06-19 MED ORDER — AZITHROMYCIN 250 MG PO TABS: ORAL_TABLET | ORAL | 0 refills | Status: DC

## 2018-06-19 MED ORDER — ETODOLAC 400 MG PO TABS: ORAL_TABLET | ORAL | 0 refills | Status: DC

## 2018-06-19 NOTE — Progress Notes (Signed)
   Subjective:    Patient ID: Harold Trujillo, male    DOB: [birth year], 57 y.o.   MRN: [id number]  HPI  Patient arrives with ear pain for 3 days Pain discomfort in the ear and the throat in the neck region drainage coughing denies high fever chills sweats denies wheezing difficulty breathing PMH benign symptoms over the past few days states this is typical for him very similar to what he gets when he gets a sinus infection Review of Systems  Constitutional: Negative for activity change, chills and fever.  HENT: Positive for rhinorrhea. Negative for congestion and ear pain.   Eyes: Negative for discharge.  Respiratory: Negative for cough and wheezing.   Cardiovascular: Negative for chest pain.  Gastrointestinal: Negative for nausea and vomiting.  Musculoskeletal: Negative for arthralgias.       Objective:   Physical Exam  Constitutional: He appears well-developed.  HENT:  Head: Normocephalic.  Mouth/Throat: Oropharynx is clear and moist. No oropharyngeal exudate.  Neck: Normal range of motion.  Cardiovascular: Normal rate, regular rhythm and normal heart sounds.  No murmur heard. Pulmonary/Chest: Effort normal and breath sounds normal. He has no wheezes.  Lymphadenopathy:    He has no cervical adenopathy.  Neurological: He exhibits normal muscle tone.  Skin: Skin is warm and dry.  Nursing note and vitals reviewed.  He has seen Dr. Richardson Landry Has had intermittent right shoulder pain exam normal      Assessment & Plan:  Patient with intermittent right intermittent requests refills of anti-inflammatory  Refill given if ongoing troubles orthopedic referral minimize weight lifting for now  Sinusitis antibiotics follow-up if ongoing troubles warnings discussed

## 2018-06-29 ENCOUNTER — Other Ambulatory Visit: Payer: Self-pay | Admitting: Family Medicine

## 2018-06-29 ENCOUNTER — Telehealth: Payer: Self-pay | Admitting: Family Medicine

## 2018-06-29 MED ORDER — DOXYCYCLINE HYCLATE 100 MG PO TABS: ORAL_TABLET | ORAL | 0 refills | Status: DC

## 2018-06-29 NOTE — Telephone Encounter (Signed)
Sent med in and pt notified. Pt verbalized understanding.

## 2018-06-29 NOTE — Telephone Encounter (Signed)
Patient seen Dr. Nicki Reaper on 06/19/18 for rhinosinusitis.  He said he is basically still having the same symptoms, sore throat, ear pain.  He is requesting new Rx called in.  The z-pak did not clear it up.  [place]

## 2018-06-29 NOTE — Telephone Encounter (Signed)
Doxycycline 100 mg 1 twice daily for the next 10 days, take with a snack in a tall glass of water, avoid excessive sun

## 2018-07-03 ENCOUNTER — Other Ambulatory Visit: Payer: Self-pay | Admitting: Family Medicine

## 2018-07-03 ENCOUNTER — Telehealth: Payer: Self-pay | Admitting: Family Medicine

## 2018-07-03 MED ORDER — CLINDAMYCIN HCL 300 MG PO CAPS: ORAL_CAPSULE | ORAL | 0 refills | Status: DC

## 2018-07-03 NOTE — Telephone Encounter (Signed)
Discontinue doxycycline. May prescribe clindamycin 300 mg 1 3 times daily for 7 days Follow-up office visit next week as planned Follow-up sooner at ER if significantly worse (If persistent despite antibiotics may need ENT referral)

## 2018-07-03 NOTE — Telephone Encounter (Signed)
Pt wife called stating that patient is worse and having pain when he swallows. Pt was seen 06/19/18 for acute rhinosinusitis; given Z pack; on 06/29/18 change abt to doxy 100mg . No fever, no SOB, no chest congestion. Would like another antibiotic and has scheduled a follow up with PCP for next week. [place].

## 2018-07-03 NOTE — Telephone Encounter (Signed)
Medication sent in and pt notified. Pt verbalized understanding.

## 2018-07-06 ENCOUNTER — Encounter: Payer: Self-pay | Admitting: Family Medicine

## 2018-07-06 ENCOUNTER — Ambulatory Visit: Payer: 59 | Admitting: Family Medicine

## 2018-07-06 VITALS — BP 132/98 | Temp 98.4°F | Ht 73.0 in | Wt 292.0 lb

## 2018-07-06 DIAGNOSIS — H6502 Acute serous otitis media, left ear: Secondary | ICD-10-CM | POA: Diagnosis not present

## 2018-07-06 DIAGNOSIS — M25511 Pain in right shoulder: Secondary | ICD-10-CM

## 2018-07-06 DIAGNOSIS — M7541 Impingement syndrome of right shoulder: Secondary | ICD-10-CM

## 2018-07-06 DIAGNOSIS — G8929 Other chronic pain: Secondary | ICD-10-CM

## 2018-07-06 NOTE — Progress Notes (Signed)
   Subjective:    Patient ID: Harold Trujillo, male    DOB: [birth year], 57 y.o.   MRN: [id number]  HPI Patient is here today with complaints of left ear pain. He is now on his third antibx. He has tried a z pak and doxycycline and he states the doxycycline made him feel worse, like he had the flu.   He is now on clindamycin 300 mg three times daily feels this has helped some.  Handling g I wse ok  States overall ear has improved somewhat.  And on the clindamycin well.  No obvious GI side effects.  Patient also very concerned about his chronic shoulder pain.  Worse with certain motions.  Difficulty with extension.  Feels crepitations at times with it.  Has had injection in the shoulder in the past with          Review of Systems No headache, no major weight loss or weight gain, no chest pain no back pain abdominal pain no change in bowel habits complete ROS otherwise negative     Objective:   Physical Exam  Alert, mild malaise. Hydration good Vitals stable.  Left otitis media. pharynx normal neck supple  lungs clear/no crackles or wheezes. heart regular in rhythm  Positive impingement sign right shoulder.  Positive crepitation with extension.  Positive tenderness to the [facility] region  Patient was prepped draped sterilized injected with 1 cc Depo-Medrol 2 cc Xylocaine posterior lateral approach.    Impression persistent otitis media see all phone messages reviewed to maintain clindamycin will call for additional 1 weeks were to persist  2.  Chronic shoulder pain.  Hopefully injection will help.  If persists will need to see       Assessment & Plan:

## 2018-07-07 MED ORDER — METHYLPREDNISOLONE ACETATE 40 MG/ML IJ SUSP: 40.0000 mg | Freq: Once | INTRAMUSCULAR | Status: DC

## 2018-07-13 ENCOUNTER — Telehealth: Payer: Self-pay | Admitting: Family Medicine

## 2018-07-13 MED ORDER — SULFAMETHOXAZOLE-TRIMETHOPRIM 800-160 MG PO TABS: ORAL_TABLET | ORAL | 0 refills | Status: DC

## 2018-07-13 NOTE — Telephone Encounter (Signed)
Patient has been on clindamycin times 2

## 2018-07-13 NOTE — Telephone Encounter (Signed)
Would not rec anymore clindam,ycin, inner ear issues can persis well beyond bacterilogical cure of ear infxn, one more round of abx, bactrim ds bid for ten d

## 2018-07-13 NOTE — Telephone Encounter (Signed)
Medication has been sent to pharmacy requested. I called left a message asked that he r/c so I can notify of the medication.

## 2018-07-13 NOTE — Telephone Encounter (Signed)
Patient seen Dr. Richardson Landry on 07/06/18 for ear infection.  He is still having inner ear issues.  He is requesting a refill on his antibiotic.  [place]

## 2018-07-13 NOTE — Telephone Encounter (Signed)
Patient is aware 

## 2018-07-19 ENCOUNTER — Encounter: Payer: Self-pay | Admitting: Family Medicine

## 2018-07-22 ENCOUNTER — Other Ambulatory Visit (HOSPITAL_COMMUNITY)
Admission: RE | Admit: 2018-07-22 | Discharge: 2018-07-22 | Disposition: A | Discharge status: Home / Self Care | Payer: 59 | Source: Ambulatory Visit | Attending: Family Medicine | Admitting: Family Medicine

## 2018-07-22 ENCOUNTER — Encounter: Payer: Self-pay | Admitting: Family Medicine

## 2018-07-22 ENCOUNTER — Ambulatory Visit: Payer: 59 | Admitting: Family Medicine

## 2018-07-22 VITALS — BP 126/90 | Ht 73.0 in | Wt 292.8 lb

## 2018-07-22 DIAGNOSIS — E611 Iron deficiency: Secondary | ICD-10-CM

## 2018-07-22 DIAGNOSIS — H6502 Acute serous otitis media, left ear: Secondary | ICD-10-CM | POA: Diagnosis not present

## 2018-07-22 DIAGNOSIS — R5383 Other fatigue: Secondary | ICD-10-CM | POA: Diagnosis not present

## 2018-07-22 DIAGNOSIS — D751 Secondary polycythemia: Secondary | ICD-10-CM

## 2018-07-22 DIAGNOSIS — Z125 Encounter for screening for malignant neoplasm of prostate: Secondary | ICD-10-CM | POA: Diagnosis not present

## 2018-07-22 DIAGNOSIS — E291 Testicular hypofunction: Secondary | ICD-10-CM | POA: Diagnosis not present

## 2018-07-22 DIAGNOSIS — E785 Hyperlipidemia, unspecified: Secondary | ICD-10-CM | POA: Diagnosis not present

## 2018-07-22 LAB — CBC WITH DIFFERENTIAL/PLATELET
BASOS ABS: 0.1 10*3/uL (ref 0.0–0.1)
BASOS PCT: 1 %
Eosinophils Absolute: 0.3 10*3/uL (ref 0.0–0.7)
Eosinophils Relative: 3 %
HCT: 56.2 % — ABNORMAL HIGH (ref 39.0–52.0)
Hemoglobin: 19.1 g/dL — ABNORMAL HIGH (ref 13.0–17.0)
LYMPHS PCT: 24 %
Lymphs Abs: 2.4 10*3/uL (ref 0.7–4.0)
MCH: 29.6 pg (ref 26.0–34.0)
MCHC: 34 g/dL (ref 30.0–36.0)
MCV: 87 fL (ref 78.0–100.0)
MONO ABS: 1 10*3/uL (ref 0.1–1.0)
Monocytes Relative: 10 %
Neutro Abs: 6.5 10*3/uL (ref 1.7–7.7)
Neutrophils Relative %: 62 %
Platelets: 255 10*3/uL (ref 150–400)
RBC: 6.46 MIL/uL — AB (ref 4.22–5.81)
RDW: 15 % (ref 11.5–15.5)
WBC: 10.3 10*3/uL (ref 4.0–10.5)

## 2018-07-22 LAB — POCT HEMOGLOBIN: HEMOGLOBIN: 19.1 g/dL — AB (ref 14.1–18.1)

## 2018-07-22 NOTE — Progress Notes (Signed)
   Subjective:    Patient ID: Harold Trujillo, male    DOB: May 14, 1961, 57 y.o.   MRN: [id number]  HPI  Pt here today for continuing ear infection. Pt has been on 3 different ABT and is currently on Bactrim. Pt has 2 days left of that ABT. Pt is flying to [place] next week.  Patient no longer experiencing pain.  No fever.  Just a full sensation in the ear.  At times postop.  At times popping with swallowing.  Bothersome to the patient but not severe severely so  Patient also notes that he is on multiple hormonal supplement from multiple sources.  Dr. Anastasio Champion has been on testosterone.  Online [place] clinicians have him on growth hormone.  Patient states he feels much better doing this.  However he has not had a CBC done.  Wonders if he needs one.  And testosterone    Review of Systems No headache, no major weight loss or weight gain, no chest pain no back pain abdominal pain no change in bowel habits complete ROS otherwise negative     Objective:   Physical Exam Alert vitals stable, NAD. Blood pressure good on repeat. HEENT normal. Lungs clear. Heart regular rate and rhythm.  Results for orders placed or performed in visit on 07/22/18  POCT hemoglobin  Result Value Ref Range   Hemoglobin 19.1 (A) 14.1 - 18.1 g/dL          Assessment & Plan:  Impression residual eustachian tube dysfunction with residual symptomatology.  Recommend no further managed his current regimen.  Impending interim state flight.  Advised on measures to open up eustachian to sinuses while following.  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  Check CBC if hemoglobin definite elevated will need to stop this medication because of increased risk of serious issues discussed  Greater than 50% of this 25 minute face to face visit was spent in counseling and discussion and coordination of care regarding the above diagnosis/diagnosies  Addendum hemoglobin returned at 19.1.  Patient advised to  stop hormone supplement.  Advised to contact both of his physicians who are providing these substances.  Advised to follow-up with him regarding his CBC management and follow-up since the patient is not recruiting me at all in regards to this chronic hormone supplementation which I frankly do not agree with and patient aware

## 2018-08-10 ENCOUNTER — Other Ambulatory Visit: Payer: Self-pay

## 2018-08-11 MED ORDER — ALPRAZOLAM 1 MG PO TABS: ORAL_TABLET | ORAL | 4 refills | Status: DC

## 2018-08-11 NOTE — Telephone Encounter (Signed)
Ok plus 4 ref monthly

## 2018-08-12 ENCOUNTER — Other Ambulatory Visit: Payer: Self-pay

## 2018-08-12 ENCOUNTER — Telehealth: Payer: Self-pay | Admitting: Family Medicine

## 2018-08-12 MED ORDER — ALPRAZOLAM 1 MG PO TABS: ORAL_TABLET | ORAL | 4 refills | Status: DC

## 2018-08-19 ENCOUNTER — Encounter: Payer: Self-pay | Admitting: Family Medicine

## 2018-08-21 ENCOUNTER — Other Ambulatory Visit: Payer: Self-pay | Admitting: Family Medicine

## 2018-08-24 MED ORDER — ETODOLAC 400 MG PO TABS: ORAL_TABLET | ORAL | 0 refills | Status: DC

## 2018-09-06 ENCOUNTER — Encounter: Payer: Self-pay | Admitting: Family Medicine

## 2018-09-07 ENCOUNTER — Other Ambulatory Visit: Payer: Self-pay

## 2018-09-07 DIAGNOSIS — H9209 Otalgia, unspecified ear: Secondary | ICD-10-CM

## 2018-09-13 ENCOUNTER — Encounter: Payer: Self-pay | Admitting: Family Medicine

## 2018-09-14 ENCOUNTER — Other Ambulatory Visit: Payer: Self-pay | Admitting: Family Medicine

## 2018-09-14 DIAGNOSIS — R7989 Other specified abnormal findings of blood chemistry: Secondary | ICD-10-CM

## 2018-09-14 NOTE — Telephone Encounter (Signed)
I do not prescribe this.-  Urology sometimes does-testosterone has fallen out of favor because it does increase her risk of strokes and heart attacks in older men

## 2018-09-14 NOTE — Telephone Encounter (Signed)
May have referral for this for urology

## 2018-09-16 DIAGNOSIS — H9313 Tinnitus, bilateral: Secondary | ICD-10-CM | POA: Diagnosis not present

## 2018-10-06 ENCOUNTER — Other Ambulatory Visit: Payer: Self-pay | Admitting: Family Medicine

## 2018-10-07 DIAGNOSIS — H6993 Unspecified Eustachian tube disorder, bilateral: Secondary | ICD-10-CM | POA: Diagnosis not present

## 2018-11-10 DIAGNOSIS — E349 Endocrine disorder, unspecified: Secondary | ICD-10-CM | POA: Diagnosis not present

## 2018-11-18 NOTE — Telephone Encounter (Signed)
error 

## 2019-02-19 ENCOUNTER — Other Ambulatory Visit: Payer: Self-pay | Admitting: Family Medicine

## 2019-02-22 NOTE — Telephone Encounter (Signed)
Six mo worth 

## 2019-05-10 ENCOUNTER — Encounter (INDEPENDENT_AMBULATORY_CARE_PROVIDER_SITE_OTHER): Payer: Self-pay

## 2019-05-10 ENCOUNTER — Other Ambulatory Visit: Payer: BLUE CROSS/BLUE SHIELD

## 2019-05-10 ENCOUNTER — Other Ambulatory Visit: Payer: Self-pay

## 2019-05-10 DIAGNOSIS — Z20822 Contact with and (suspected) exposure to covid-19: Secondary | ICD-10-CM

## 2019-05-11 ENCOUNTER — Encounter (INDEPENDENT_AMBULATORY_CARE_PROVIDER_SITE_OTHER): Payer: Self-pay

## 2019-05-12 ENCOUNTER — Encounter (INDEPENDENT_AMBULATORY_CARE_PROVIDER_SITE_OTHER): Payer: Self-pay

## 2019-05-13 ENCOUNTER — Encounter (INDEPENDENT_AMBULATORY_CARE_PROVIDER_SITE_OTHER): Payer: Self-pay

## 2019-05-13 LAB — NOVEL CORONAVIRUS, NAA: SARS-CoV-2, NAA: NOT DETECTED

## 2019-05-14 ENCOUNTER — Encounter (INDEPENDENT_AMBULATORY_CARE_PROVIDER_SITE_OTHER): Payer: Self-pay

## 2019-05-17 ENCOUNTER — Encounter (INDEPENDENT_AMBULATORY_CARE_PROVIDER_SITE_OTHER): Payer: Self-pay

## 2019-05-18 ENCOUNTER — Encounter (INDEPENDENT_AMBULATORY_CARE_PROVIDER_SITE_OTHER): Payer: Self-pay

## 2019-05-19 ENCOUNTER — Encounter (INDEPENDENT_AMBULATORY_CARE_PROVIDER_SITE_OTHER): Payer: Self-pay

## 2019-05-20 ENCOUNTER — Encounter (INDEPENDENT_AMBULATORY_CARE_PROVIDER_SITE_OTHER): Payer: Self-pay

## 2019-05-22 ENCOUNTER — Encounter (INDEPENDENT_AMBULATORY_CARE_PROVIDER_SITE_OTHER): Payer: Self-pay

## 2019-07-21 ENCOUNTER — Encounter: Payer: Self-pay | Admitting: Family Medicine

## 2019-07-22 NOTE — Telephone Encounter (Signed)
Patient's appointment was rescheduled for 08/05/2019

## 2019-07-23 ENCOUNTER — Ambulatory Visit: Payer: 59 | Admitting: Family Medicine

## 2019-07-23 ENCOUNTER — Other Ambulatory Visit: Payer: Self-pay | Admitting: *Deleted

## 2019-07-23 ENCOUNTER — Encounter: Payer: Self-pay | Admitting: Family Medicine

## 2019-07-23 MED ORDER — AMLODIPINE BESYLATE 5 MG PO TABS: 5.0000 mg | ORAL_TABLET | Freq: Every day | ORAL | 0 refills | Status: DC

## 2019-07-23 NOTE — Telephone Encounter (Signed)
Per Dr Richardson Landry: Bp s last yr were borderline. Start norvasc five mg each night, numb 30 well tolerated we will not add anything to this til we see pt face to face. By early mid next wk should see some improvement, but, as noted, will not add addnl

## 2019-07-23 NOTE — Telephone Encounter (Signed)
Called and discussed with pt. Pt verbalized understanding. Med sent to pharm 

## 2019-07-28 ENCOUNTER — Encounter: Payer: Self-pay | Admitting: Family Medicine

## 2019-07-28 ENCOUNTER — Ambulatory Visit (INDEPENDENT_AMBULATORY_CARE_PROVIDER_SITE_OTHER): Payer: 59 | Admitting: Family Medicine

## 2019-07-28 ENCOUNTER — Other Ambulatory Visit: Payer: Self-pay

## 2019-07-28 VITALS — BP 137/92 | Wt 290.0 lb

## 2019-07-28 DIAGNOSIS — F411 Generalized anxiety disorder: Secondary | ICD-10-CM | POA: Diagnosis not present

## 2019-07-28 DIAGNOSIS — B349 Viral infection, unspecified: Secondary | ICD-10-CM | POA: Diagnosis not present

## 2019-07-28 DIAGNOSIS — I10 Essential (primary) hypertension: Secondary | ICD-10-CM

## 2019-07-28 MED ORDER — HYDROXYZINE HCL 25 MG PO TABS: ORAL_TABLET | ORAL | 2 refills | Status: DC

## 2019-07-28 MED ORDER — BUPROPION HCL ER (XL) 300 MG PO TB24: ORAL_TABLET | ORAL | 5 refills | Status: DC

## 2019-07-28 NOTE — Progress Notes (Signed)
   Subjective:  Audio plus video patient calls with protracted concerns  Patient ID: RIHAN Trujillo, male    DOB: 1961/01/03, 58 y.o.   MRN: [id number]  Hypertension (Headache, flush face, felt bad all last week. ) There are no compliance problems.    Pt states he began taking the Amlodipine 5 mg on Sunday night. Pt states on Saturday he developed a rash and he wakes up itching. Pt states that he went at got COVID testing last week because he felt so bad. Pt sent a my chart message along with picture of recent blood pressures. BP has been consistently high for the past 5 days. Pt states he is trying to eat healthier, exercise and get better.   Virtual Visit via Video Note  I connected with Rossie Muskrat on 07/28/19 at  3:50 PM EDT by a video enabled telemedicine application and verified that I am speaking with the correct person using two identifiers.  Location: Patient: home Provider: office   I discussed the limitations of evaluation and management by telemedicine and the availability of in person appointments. The patient expressed understanding and agreed to proceed.  History of Present Illness:    Observations/Objective:   Assessment and Plan:   Follow Up Instructions:    I discussed the assessment and treatment plan with the patient. The patient was provided an opportunity to ask questions and all were answered. The patient agreed with the plan and demonstrated an understanding of the instructions.   The patient was advised to call back or seek an in-person evaluation if the symptoms worsen or if the condition fails to improve as anticipated.  I provided 28 minutes of non-face-to-face time during this encounter.  Patient notes his chronic anxiety is worsening somewhat.  Also tendency towards depression is worsened today.  No suicidal homicidal thoughts.  Feeling very anxious.  Stressed out a lot.  Very itchy at times.  Yet no rash  Handling new blood pressure  medication recently well no obvious side effects.  Blood pressure still somewhat elevated history reviewed  Still having achiness congestion sore throat etc. which wife plans to get his COVID-19 test in the first place.  This has returned negative.  Of note wife had some work issues Vicente Males, LPN    Review of Systems No headache, no major weight loss or weight gain, no chest pain no back pain abdominal pain no change in bowel habits complete ROS otherwise negative     Objective:   Physical Exam  Virtual      Assessment & Plan:  Impression hypertension.  Good control discussed maintain same meds for now rationale discussed  2.  Generalized anxiety disorder clinically worsening.  Will resume Wellbutrin.  Rationale discussed.  3.  Pruritus.  Likely secondary to 2 discussed may use hydroxyzine  4.  Viral syndrome clinically somewhat better.  Negative COVID-19 discussed symptom care discussed

## 2019-07-28 NOTE — Telephone Encounter (Signed)
Patient scheduled virtual visit tonight with Dr Richardson Landry

## 2019-07-29 ENCOUNTER — Encounter: Payer: Self-pay | Admitting: Family Medicine

## 2019-07-30 NOTE — Telephone Encounter (Signed)
Patient saw Dr. Richardson Landry I am  not familiar with his current situation   the patient can double up on hydroxyzine if necessary although that can cause some drowsiness  Please let the patient know that this message will be forwarded to Dr. Richardson Landry for his viewing on Monday so we can give further suggestions  Please forward message to Dr. Richardson Landry after communicating with patient

## 2019-08-02 NOTE — Telephone Encounter (Signed)
Mikey Kirschner, MD  You 1 hour ago (8:40 AM)   I agree with dr scotts message, I am not surprised he was still itching one day after our visit and initiation of new meds

## 2019-08-05 ENCOUNTER — Encounter: Payer: Self-pay | Admitting: Family Medicine

## 2019-08-05 ENCOUNTER — Other Ambulatory Visit: Payer: Self-pay

## 2019-08-05 ENCOUNTER — Ambulatory Visit: Payer: 59 | Admitting: Family Medicine

## 2019-08-05 VITALS — BP 122/78 | Temp 98.0°F | Ht 73.0 in | Wt 290.0 lb

## 2019-08-05 DIAGNOSIS — M25519 Pain in unspecified shoulder: Secondary | ICD-10-CM | POA: Diagnosis not present

## 2019-08-05 DIAGNOSIS — M7541 Impingement syndrome of right shoulder: Secondary | ICD-10-CM

## 2019-08-05 DIAGNOSIS — G8929 Other chronic pain: Secondary | ICD-10-CM

## 2019-08-05 DIAGNOSIS — F411 Generalized anxiety disorder: Secondary | ICD-10-CM

## 2019-08-05 DIAGNOSIS — I10 Essential (primary) hypertension: Secondary | ICD-10-CM | POA: Diagnosis not present

## 2019-08-05 DIAGNOSIS — D751 Secondary polycythemia: Secondary | ICD-10-CM

## 2019-08-05 MED ORDER — AMLODIPINE BESYLATE 5 MG PO TABS: 5.0000 mg | ORAL_TABLET | Freq: Every day | ORAL | 5 refills | Status: DC

## 2019-08-05 MED ORDER — ALPRAZOLAM 1 MG PO TABS: ORAL_TABLET | ORAL | 5 refills | Status: DC

## 2019-08-05 MED ORDER — BUPROPION HCL ER (XL) 300 MG PO TB24: ORAL_TABLET | ORAL | 5 refills | Status: DC

## 2019-08-05 NOTE — Progress Notes (Signed)
   Subjective:    Patient ID: Harold Trujillo, male    DOB: 04/18/1961, 58 y.o.   MRN: [id number]  HPIright shoulder pain for years but worse the past 4 months. Would like to see ortho.  Blood pressure medicine and blood pressure levels reviewed today with patient. Compliant with blood pressure medicine. States does not miss a dose. No obvious side effects. Blood pressure generally good when checked elsewhere. Watching salt intake.   Patient notes ongoing compliance with antidepressant medication. No obvious side effects. Reports does not miss a dose. Overall continues to help depression substantially. No thoughts of homicide or suicide. Would like to maintain medication.  Itching overal is sig better   Chronic sholder pain   Has sig recurrent problems      Chronic shoulder pain sig in nature   Has lost weight exercising  Now on tesotterone again     Review of Systems No headache, no major weight loss or weight gain, no chest pain no back pain abdominal pain no change in bowel habits complete ROS otherwise negative     Objective:   Physical Exam Alert and oriented, vitals reviewed and stable, NAD ENT-TM's and ext canals WNL bilat via otoscopic exam Soft palate, tonsils and post pharynx WNL via oropharyngeal exam Neck-symmetric, no masses; thyroid nonpalpable and nontender Pulmonary-no tachypnea or accessory muscle use; Clear without wheezes via auscultation Card--no abnrml murmurs, rhythm reg and rate WNL Carotid pulses symmetric, without bruits Right shoulder positive impingement sign.  No deformity.  Ankles trace edema       Assessment & Plan:  Impression hypertension.  Blood pressure numbers reviewed.  Overall doing better discussed to maintain same  2.  Depression/anxiety.  Handling new medication well.  Overall anxiety is improved.  Depression is also improved.  Definitely wants to stay on the medication.  3.  Chronic shoulder pain.  Worsening.  Positive  impingement sign.  Patient would really prefer to go right to an orthopedic surgeon.  Rationale discussed  4.  Testosterone supplement discussed yet once again.  Patient found another specialist in [place] willing to prescribe it.  This despite his history of polycythemia.  Encouraged patient to be very careful with this.  Discussion held  Follow-up as scheduled diet exercise discussed salt intake discussed we will work on orthopedic referral  Greater than 50% of this 25 minute face to face visit was spent in counseling and discussion and coordination of care regarding the above diagnosis/diagnosies

## 2019-08-16 ENCOUNTER — Encounter: Payer: Self-pay | Admitting: Family Medicine

## 2019-08-24 ENCOUNTER — Encounter: Payer: Self-pay | Admitting: Family Medicine

## 2019-08-25 ENCOUNTER — Ambulatory Visit: Payer: 59 | Admitting: Family Medicine

## 2019-08-25 ENCOUNTER — Other Ambulatory Visit: Payer: Self-pay

## 2019-08-25 VITALS — BP 132/80 | Temp 97.4°F | Wt 280.6 lb

## 2019-08-25 DIAGNOSIS — F411 Generalized anxiety disorder: Secondary | ICD-10-CM

## 2019-08-25 DIAGNOSIS — I10 Essential (primary) hypertension: Secondary | ICD-10-CM | POA: Diagnosis not present

## 2019-08-25 MED ORDER — BUPROPION HCL ER (XL) 450 MG PO TB24: ORAL_TABLET | ORAL | 1 refills | Status: DC

## 2019-08-25 MED ORDER — HYDROXYZINE HCL 25 MG PO TABS: ORAL_TABLET | ORAL | 5 refills | Status: DC

## 2019-08-25 MED ORDER — PAROXETINE HCL 10 MG PO TABS: ORAL_TABLET | ORAL | 1 refills | Status: DC

## 2019-08-25 NOTE — Progress Notes (Signed)
   Subjective:    Patient ID: Harold Trujillo, male    DOB: 03-07-1961, 58 y.o.   MRN: [id number]  HPI  Patient arrives to discuss blood pressure. Patient states his machine is always reading high and it is causing anxiety. BP manuel here 132/80 and machine 154/88   Blood pressure medicine and blood pressure levels reviewed today with patient. Compliant with blood pressure medicine. States does not miss a dose. No obvious side effects. Blood pressure generally good when checked elsewhere. Watching salt intake.  Has lost 15 pounds lost weight     Mom has diabetes   No glu    eercise overall god  Patient has gone into considerable discussion regarding his mental status.  Anxiety has worsened.  Feeling down has worsened.  States definitely not suicidal.  Just feels the Wellbutrin is not to be enough.  In the past Paxil helped but he had to take a high enough dose and it affected his sexual function.  Review of Systems No headache, no major weight loss or weight gain, no chest pain no back pain abdominal pain no change in bowel habits complete ROS otherwise negative     Objective:   Physical Exam   Alert and oriented, vitals reviewed and stable, NAD ENT-TM's and ext canals WNL bilat via otoscopic exam Soft palate, tonsils and post pharynx WNL via oropharyngeal exam Neck-symmetric, no masses; thyroid nonpalpable and nontender Pulmonary-no tachypnea or accessory muscle use; Clear without wheezes via auscultation Card--no abnrml murmurs, rhythm reg and rate WNL Carotid pulses symmetric, without bruits      Assessment & Plan:  Impression 1 hypertension.  Blood pressure good on repeat to maintain same dose of medicine diet exercise discussed  2.  Mood disorder with element of anxiety and depression.  Discussed at length.  Will resume Paxil low dose and new Wellbutrin took full dose rationale discussed potential warning signs discussed  Greater than 50% of this 25 minute face to  face visit was spent in counseling and discussion and coordination of care regarding the above diagnosis/diagnosies

## 2019-08-29 ENCOUNTER — Encounter: Payer: Self-pay | Admitting: Family Medicine

## 2019-11-24 ENCOUNTER — Ambulatory Visit: Payer: 59 | Admitting: Family Medicine

## 2020-01-05 ENCOUNTER — Encounter: Payer: Self-pay | Admitting: Family Medicine

## 2020-02-03 ENCOUNTER — Ambulatory Visit: Payer: BLUE CROSS/BLUE SHIELD | Attending: Internal Medicine

## 2020-02-03 DIAGNOSIS — Z23 Encounter for immunization: Secondary | ICD-10-CM

## 2020-02-03 NOTE — Progress Notes (Signed)
   Covid-19 Vaccination Clinic  Name:  Harold Trujillo    MRN: [id number] DOB: 08-11-1961  02/03/2020  Harold Trujillo was observed post Covid-19 immunization for 15 minutes without incident. He was provided with Vaccine Information Sheet and instruction to access the V-Safe system.   Harold Trujillo was instructed to call 911 with any severe reactions post vaccine: Marland Kitchen Difficulty breathing  . Swelling of face and throat  . A fast heartbeat  . A bad rash all over body  . Dizziness and weakness   Immunizations Administered    Name Date Dose VIS Date Route   Moderna COVID-19 Vaccine 02/03/2020  8:32 AM 0.5 mL 11/02/2019 Intramuscular   Manufacturer: Moderna   Lot: [id number]   [id number]

## 2020-02-09 ENCOUNTER — Ambulatory Visit: Payer: 59 | Admitting: Family Medicine

## 2020-02-09 ENCOUNTER — Other Ambulatory Visit: Payer: Self-pay

## 2020-02-09 ENCOUNTER — Encounter: Payer: Self-pay | Admitting: Family Medicine

## 2020-02-09 ENCOUNTER — Telehealth: Payer: Self-pay | Admitting: Family Medicine

## 2020-02-09 VITALS — BP 136/82 | Temp 97.4°F | Ht 73.0 in | Wt 303.0 lb

## 2020-02-09 DIAGNOSIS — F329 Major depressive disorder, single episode, unspecified: Secondary | ICD-10-CM

## 2020-02-09 DIAGNOSIS — F411 Generalized anxiety disorder: Secondary | ICD-10-CM

## 2020-02-09 DIAGNOSIS — I10 Essential (primary) hypertension: Secondary | ICD-10-CM | POA: Diagnosis not present

## 2020-02-09 DIAGNOSIS — F32A Depression, unspecified: Secondary | ICD-10-CM

## 2020-02-09 MED ORDER — PAROXETINE HCL 10 MG PO TABS: ORAL_TABLET | ORAL | 1 refills | Status: DC

## 2020-02-09 MED ORDER — AMLODIPINE BESYLATE 5 MG PO TABS: 5.0000 mg | ORAL_TABLET | Freq: Every day | ORAL | 1 refills | Status: DC

## 2020-02-09 MED ORDER — BUPROPION HCL ER (XL) 450 MG PO TB24: ORAL_TABLET | ORAL | 1 refills | Status: DC

## 2020-02-09 MED ORDER — BUPROPION HCL ER (XL) 150 MG PO TB24: ORAL_TABLET | ORAL | 1 refills | Status: DC

## 2020-02-09 MED ORDER — ALPRAZOLAM 1 MG PO TABS: ORAL_TABLET | ORAL | 5 refills | Status: DC

## 2020-02-09 MED ORDER — BUPROPION HCL ER (XL) 300 MG PO TB24: ORAL_TABLET | ORAL | 1 refills | Status: DC

## 2020-02-09 MED ORDER — HYDROXYZINE HCL 25 MG PO TABS: ORAL_TABLET | ORAL | 5 refills | Status: DC

## 2020-02-09 NOTE — Progress Notes (Signed)
   Subjective:    Patient ID: Harold Trujillo, male    DOB: 06-May-1961, 59 y.o.   MRN: [id number]  Hypertension This is a chronic problem. Treatments tried: norvasc. Compliance problems: takes meds every day, eats alot of fast food since he is on the road alot, just brought an exercise bike.    Pt states no concerns today.  Patient notes ongoing compliance with antidepressant medication. No obvious side effects. Reports does not miss a dose. Overall continues to help depression substantially. No thoughts of homicide or suicide. Would like to maintain medication.  Does a wellness  Visit with wellness provider in [place], they follo l his screning b     Blood pressure medicine and blood pressure levels reviewed today with patient. Compliant with blood pressure medicine. States does not miss a dose. No obvious side effects. Blood pressure generally good when checked elsewhere. Watching salt intake.  134 29   Pt now has a bike and using regularly   Review of Systems No headache no chest pain no shortness of breath    Objective:   Physical Exam  Alert vitals stable, NAD. Blood pressure good on repeat. HEENT normal. Lungs clear. Heart regular rate and rhythm.       Assessment & Plan:  Impression 1 hypertension very good control discussed to maintain same meds compliance discussed  2.  Chronic depression with elements of anxiety.  Clinically stable on current medications.  To maintain.  Of note patient gets all of his wellness interventions elsewhere.  Diet and exercise discussed and encouraged  Follow-up in 6 months

## 2020-02-09 NOTE — Telephone Encounter (Signed)
Sure six mo worth 

## 2020-02-09 NOTE — Telephone Encounter (Signed)
Melissa at [facility] calling about pts welbutrin that was sent in

## 2020-02-09 NOTE — Telephone Encounter (Signed)
Buproprion 150 amd 300 mg sent to pharmacy and pt is aware

## 2020-02-09 NOTE — Telephone Encounter (Signed)
Spoke with Jonni Sanger at [facility]. Jonni Sanger states that insurance will not cover Buproprion 450 mg. Insurance may cover the 300 mg plus 150 mg. Please advise. Thank you

## 2020-03-07 ENCOUNTER — Ambulatory Visit: Payer: BLUE CROSS/BLUE SHIELD | Attending: Internal Medicine

## 2020-03-07 DIAGNOSIS — Z23 Encounter for immunization: Secondary | ICD-10-CM

## 2020-03-07 NOTE — Progress Notes (Signed)
   Covid-19 Vaccination Clinic  Name:  IZSAK HARTUNIAN    MRN: [id number] DOB: 14-Sep-1961  03/07/2020  Mr. Stavropoulos was observed post Covid-19 immunization for 15 minutes without incident. He was provided with Vaccine Information Sheet and instruction to access the V-Safe system.   Mr. Ging was instructed to call 911 with any severe reactions post vaccine: Marland Kitchen Difficulty breathing  . Swelling of face and throat  . A fast heartbeat  . A bad rash all over body  . Dizziness and weakness   Immunizations Administered    Name Date Dose VIS Date Route   Moderna COVID-19 Vaccine 03/07/2020  8:13 AM 0.5 mL 11/02/2019 Intramuscular   Manufacturer: Moderna   Lot: [id number]   [id number]

## 2020-06-28 ENCOUNTER — Telehealth: Payer: Self-pay | Admitting: Family Medicine

## 2020-06-28 NOTE — Telephone Encounter (Signed)
Patient needs a prescription called in for new C pap mask. [employer]

## 2020-06-29 NOTE — Telephone Encounter (Signed)
If papers are in computer and pt is due for new face mask then please write out script. Thx. Dr. Lovena Le

## 2020-06-30 NOTE — Telephone Encounter (Signed)
Patient notified

## 2020-06-30 NOTE — Telephone Encounter (Signed)
Script written out and will be faxed once signed. Left message to return call

## 2020-08-03 ENCOUNTER — Other Ambulatory Visit: Payer: Self-pay

## 2020-08-03 ENCOUNTER — Ambulatory Visit (INDEPENDENT_AMBULATORY_CARE_PROVIDER_SITE_OTHER): Payer: 59 | Admitting: Family Medicine

## 2020-08-03 ENCOUNTER — Encounter (INDEPENDENT_AMBULATORY_CARE_PROVIDER_SITE_OTHER): Payer: Self-pay | Admitting: Family Medicine

## 2020-08-03 VITALS — BP 135/84 | HR 85 | Temp 98.4°F | Ht 72.0 in | Wt 306.0 lb

## 2020-08-03 DIAGNOSIS — R5383 Other fatigue: Secondary | ICD-10-CM

## 2020-08-03 DIAGNOSIS — R7303 Prediabetes: Secondary | ICD-10-CM | POA: Diagnosis not present

## 2020-08-03 DIAGNOSIS — E559 Vitamin D deficiency, unspecified: Secondary | ICD-10-CM

## 2020-08-03 DIAGNOSIS — E785 Hyperlipidemia, unspecified: Secondary | ICD-10-CM

## 2020-08-03 DIAGNOSIS — Z9189 Other specified personal risk factors, not elsewhere classified: Secondary | ICD-10-CM

## 2020-08-03 DIAGNOSIS — Z1331 Encounter for screening for depression: Secondary | ICD-10-CM

## 2020-08-03 DIAGNOSIS — I10 Essential (primary) hypertension: Secondary | ICD-10-CM

## 2020-08-03 DIAGNOSIS — Z0289 Encounter for other administrative examinations: Secondary | ICD-10-CM

## 2020-08-03 DIAGNOSIS — R0602 Shortness of breath: Secondary | ICD-10-CM | POA: Diagnosis not present

## 2020-08-03 DIAGNOSIS — Z6841 Body Mass Index (BMI) 40.0 and over, adult: Secondary | ICD-10-CM

## 2020-08-04 LAB — COMPREHENSIVE METABOLIC PANEL
ALT: 44 IU/L (ref 0–44)
AST: 34 IU/L (ref 0–40)
Albumin/Globulin Ratio: 1.9 (ref 1.2–2.2)
Albumin: 4.7 g/dL (ref 3.8–4.9)
Alkaline Phosphatase: 69 IU/L (ref 48–121)
BUN/Creatinine Ratio: 9 (ref 9–20)
BUN: 10 mg/dL (ref 6–24)
Bilirubin Total: 0.3 mg/dL (ref 0.0–1.2)
CO2: 20 mmol/L (ref 20–29)
Calcium: 9 mg/dL (ref 8.7–10.2)
Chloride: 101 mmol/L (ref 96–106)
Creatinine, Ser: 1.15 mg/dL (ref 0.76–1.27)
GFR calc Af Amer: 81 mL/min/{1.73_m2} (ref >59)
GFR calc non Af Amer: 70 mL/min/{1.73_m2} (ref >59)
Globulin, Total: 2.5 g/dL (ref 1.5–4.5)
Glucose: 96 mg/dL (ref 65–99)
Potassium: 4.7 mmol/L (ref 3.5–5.2)
Sodium: 139 mmol/L (ref 134–144)
Total Protein: 7.2 g/dL (ref 6.0–8.5)

## 2020-08-04 LAB — FOLATE: Folate: 7.4 ng/mL (ref >3.0)

## 2020-08-04 LAB — CBC WITH DIFFERENTIAL/PLATELET
Basophils Absolute: 0.1 10*3/uL (ref 0.0–0.2)
Basos: 2 %
EOS (ABSOLUTE): 0.2 10*3/uL (ref 0.0–0.4)
Eos: 3 %
Hematocrit: 50.4 % (ref 37.5–51.0)
Hemoglobin: 15.6 g/dL (ref 13.0–17.7)
Immature Grans (Abs): 0 10*3/uL (ref 0.0–0.1)
Immature Granulocytes: 0 %
Lymphocytes Absolute: 2.1 10*3/uL (ref 0.7–3.1)
Lymphs: 28 %
MCH: 23.4 pg — ABNORMAL LOW (ref 26.6–33.0)
MCHC: 31 g/dL — ABNORMAL LOW (ref 31.5–35.7)
MCV: 76 fL — ABNORMAL LOW (ref 79–97)
Monocytes Absolute: 0.7 10*3/uL (ref 0.1–0.9)
Monocytes: 9 %
Neutrophils Absolute: 4.5 10*3/uL (ref 1.4–7.0)
Neutrophils: 58 %
Platelets: 304 10*3/uL (ref 150–450)
RBC: 6.67 x10E6/uL — ABNORMAL HIGH (ref 4.14–5.80)
RDW: 18.8 % — ABNORMAL HIGH (ref 11.6–15.4)
WBC: 7.6 10*3/uL (ref 3.4–10.8)

## 2020-08-04 LAB — LIPID PANEL
Chol/HDL Ratio: 5.4 ratio — ABNORMAL HIGH (ref 0.0–5.0)
Cholesterol, Total: 209 mg/dL — ABNORMAL HIGH (ref 100–199)
HDL: 39 mg/dL — ABNORMAL LOW (ref >39)
LDL Chol Calc (NIH): 142 mg/dL — ABNORMAL HIGH (ref 0–99)
Triglycerides: 157 mg/dL — ABNORMAL HIGH (ref 0–149)
VLDL Cholesterol Cal: 28 mg/dL (ref 5–40)

## 2020-08-04 LAB — VITAMIN D 25 HYDROXY (VIT D DEFICIENCY, FRACTURES): Vit D, 25-Hydroxy: 19.5 ng/mL — ABNORMAL LOW (ref 30.0–100.0)

## 2020-08-04 LAB — VITAMIN B12: Vitamin B-12: 637 pg/mL (ref 232–1245)

## 2020-08-04 LAB — TSH: TSH: 2.42 u[IU]/mL (ref 0.450–4.500)

## 2020-08-04 LAB — T4: T4, Total: 5.1 ug/dL (ref 4.5–12.0)

## 2020-08-04 LAB — T3: T3, Total: 107 ng/dL (ref 71–180)

## 2020-08-04 LAB — HEMOGLOBIN A1C
Est. average glucose Bld gHb Est-mCnc: 120 mg/dL
Hgb A1c MFr Bld: 5.8 % — ABNORMAL HIGH (ref 4.8–5.6)

## 2020-08-04 LAB — INSULIN, RANDOM: INSULIN: 48.8 u[IU]/mL — ABNORMAL HIGH (ref 2.6–24.9)

## 2020-08-10 ENCOUNTER — Telehealth: Payer: Self-pay | Admitting: Family Medicine

## 2020-08-10 NOTE — Telephone Encounter (Signed)
Pt needs to establish care, before more refills on controlled substances.  However, pls check with pt to see if he has another pcp appt and if switching.    Thx.   Dr. Lovena Le

## 2020-08-10 NOTE — Progress Notes (Signed)
Chief Complaint:   OBESITY Harold Trujillo (MR# [id number]) is a 59 y.o. male who presents for evaluation and treatment of obesity and related comorbidities. Current BMI is Body mass index is 41.5 kg/m. Harold Trujillo has been struggling with his weight for many years and has been unsuccessful in either losing weight, maintaining weight loss, or reaching his healthy weight goal.  Harold Trujillo is currently in the action stage of change and ready to dedicate time achieving and maintaining a healthier weight. Harold Trujillo is interested in becoming our patient and working on intensive lifestyle modifications including (but not limited to) diet and exercise for weight loss.  Harold Trujillo's habits were reviewed today and are as follows: His family eats meals together, he thinks his family will eat healthier with him, his desired weight loss is 56 lbs, he started gaining weight in 1998, his heaviest weight ever was 308 pounds, he has significant food cravings issues, he snacks frequently in the evenings, he skips meals frequently, he is frequently drinking liquids with calories, he frequently makes poor food choices, he frequently eats larger portions than normal and he struggles with emotional eating.  Depression Screen Harold Trujillo's Food and Mood (modified PHQ-9) score was 18.  Depression screen PHQ 2/9 08/03/2020  Decreased Interest 3  Down, Depressed, Hopeless 2  PHQ - 2 Score 5  Altered sleeping 3  Tired, decreased energy 3  Change in appetite 2  Feeling bad or failure about yourself  3  Trouble concentrating 2  Moving slowly or fidgety/restless 0  Suicidal thoughts 0  PHQ-9 Score 18  Difficult doing work/chores Very difficult   Subjective:   1. Other fatigue Harold Trujillo admits to daytime somnolence and admits to waking up still tired. Patent has a history of symptoms of daytime fatigue. Harold Trujillo generally gets 6 or 8 hours of sleep per night, and states that he has generally restful sleep. Snoring is not present.  Apneic episodes are not present. Epworth Sleepiness Score is 7.  2. Shortness of breath on exertion Harold Trujillo notes increasing shortness of breath with exercising and seems to be worsening over time with weight gain. He notes getting out of breath sooner with activity than he used to. This has not gotten worse recently. Harold Trujillo denies shortness of breath at rest or orthopnea.  3. Pre-diabetes Harold Trujillo has a history of pre-diabetes mellitus. He has no recent labs in [place].  4. Essential hypertension Harold Trujillo is working on diet and weight loss to improve his blood pressure.   5. Hyperlipidemia LDL goal <130 Harold Trujillo is working on diet and weight loss to improve his cholesterol.   6. Vitamin D deficiency Harold Trujillo has a history of Vit D deficiency, and he is due for labs.  7. At risk for impaired metabolic function Harold Trujillo is at increased risk for impaired metabolic function due to current nutrition and muscle mass.  Assessment/Plan:   1. Other fatigue Harold Trujillo does feel that his weight is causing his energy to be lower than it should be. Fatigue may be related to obesity, depression or many other causes. Labs will be ordered, and in the meanwhile, Harold Trujillo will focus on self care including making healthy food choices, increasing physical activity and focusing on stress reduction.  - EKG 12-Lead - Vitamin B12 - CBC with Differential/Platelet - Comprehensive metabolic panel - Folate - T3 - T4 - TSH  2. Shortness of breath on exertion Harold Trujillo does feel that he gets out of breath more easily that he used to when he exercises. Harold Trujillo's  shortness of breath appears to be obesity related and exercise induced. He has agreed to work on weight loss and gradually increase exercise to treat his exercise induced shortness of breath. Will continue to monitor closely.  3. Pre-diabetes Harold Trujillo will start his Category 4 plan, and will continue to work on weight loss, exercise, and decreasing simple  carbohydrates to help decrease the risk of diabetes. We will check labs today.  - Hemoglobin A1c - Insulin, random  4. Essential hypertension Harold Trujillo will start his Category 4 plan, and will continue working on healthy weight loss and exercise to improve blood pressure control. We will watch for signs of hypotension as he continues his lifestyle modifications. We will check lab today.  5. Hyperlipidemia LDL goal <130 Cardiovascular risk and specific lipid/LDL goals reviewed. We discussed several lifestyle modifications today. Harold Trujillo will start his Category 4 plan, and will continue to work on exercise and weight loss efforts. We will check labs today. Orders and follow up as documented in patient record.   - Lipid panel  6. Vitamin D deficiency Low Vitamin D level contributes to fatigue and are associated with obesity, breast, and colon cancer. We will check labs today. Harold Trujillo will follow-up for routine testing of Vitamin D, at least 2-3 times per year to avoid over-replacement.  - VITAMIN D 25 Hydroxy (Vit-D Deficiency, Fractures)  7. Depression screening Harold Trujillo had a positive depression screening. Depression is commonly associated with obesity and often results in emotional eating behaviors. We will monitor this closely and work on CBT to help improve the non-hunger eating patterns. Referral to Psychology may be required if no improvement is seen as he continues in our clinic.  8. At risk for impaired metabolic function Harold Trujillo was given approximately 30 minutes of impaired  metabolic function prevention counseling today. We discussed intensive lifestyle modifications today with an emphasis on specific nutrition and exercise instructions and strategies.   Repetitive spaced learning was employed today to elicit superior memory formation and behavioral change.  9. Class 3 severe obesity with serious comorbidity and body mass index (BMI) of 40.0 to 44.9 in adult, unspecified obesity type  (HCC) Harold Trujillo is currently in the action stage of change and his goal is to continue with weight loss efforts. I recommend Harold Trujillo begin the structured treatment plan as follows:  He has agreed to the Category 4 Plan.  Exercise goals: No exercise has been prescribed for now, while we concentrate on nutritional changes.   Behavioral modification strategies: increasing lean protein intake and decreasing eating out.  He was informed of the importance of frequent follow-up visits to maximize his success with intensive lifestyle modifications for his multiple health conditions. He was informed we would discuss his lab results at his next visit unless there is a critical issue that needs to be addressed sooner. Harold Trujillo agreed to keep his next visit at the agreed upon time to discuss these results.  Objective:   Blood pressure 135/84, pulse 85, temperature 98.4 F (36.9 C), height 6' (1.829 m), weight (!) 306 lb (138.8 kg), SpO2 95 %. Body mass index is 41.5 kg/m.  EKG: Normal sinus rhythm, rate 80 BPM.  Indirect Calorimeter completed today shows a VO2 of 311 and a REE of 2161.  His calculated basal metabolic rate is 1540 thus his basal metabolic rate is worse than expected.  General: Cooperative, alert, well developed, in no acute distress. HEENT: Conjunctivae and lids unremarkable. Cardiovascular: Regular rhythm.  Lungs: Normal work of breathing. Neurologic: No focal  deficits.   Lab Results  Component Value Date   CREATININE 1.15 08/03/2020   BUN 10 08/03/2020   NA 139 08/03/2020   K 4.7 08/03/2020   CL 101 08/03/2020   CO2 20 08/03/2020   Lab Results  Component Value Date   ALT 44 08/03/2020   AST 34 08/03/2020   ALKPHOS 69 08/03/2020   BILITOT 0.3 08/03/2020   Lab Results  Component Value Date   HGBA1C 5.8 (H) 08/03/2020   Lab Results  Component Value Date   INSULIN 48.8 (H) 08/03/2020   Lab Results  Component Value Date   TSH 2.420 08/03/2020   Lab Results    Component Value Date   CHOL 209 (H) 08/03/2020   HDL 39 (L) 08/03/2020   LDLCALC 142 (H) 08/03/2020   TRIG 157 (H) 08/03/2020   CHOLHDL 5.4 (H) 08/03/2020   Lab Results  Component Value Date   WBC 7.6 08/03/2020   HGB 15.6 08/03/2020   HCT 50.4 08/03/2020   MCV 76 (L) 08/03/2020   PLT 304 08/03/2020   No results found for: IRON, TIBC, FERRITIN  Attestation Statements:   Reviewed by clinician on day of visit: allergies, medications, problem list, medical history, surgical history, family history, social history, and previous encounter notes.   I, Okechukwu Regnier, am acting as transcriptionist for Dennard Nip, MD.  I have reviewed the above documentation for accuracy and completeness, and I agree with the above. - Dennard Nip, MD

## 2020-08-10 NOTE — Telephone Encounter (Signed)
[place] requesting refill on Alprazolam 1 mg tablet. Take 1/2 to one tablet at bedtime. Pt last seen 02/09/20 for HTN. Please advise. Thank you

## 2020-08-11 MED ORDER — ALPRAZOLAM 1 MG PO TABS
ORAL_TABLET | ORAL | 0 refills | Status: DC
Start: 2020-08-11 — End: 2020-09-05

## 2020-08-11 NOTE — Telephone Encounter (Signed)
Left message to return call 

## 2020-08-11 NOTE — Addendum Note (Signed)
Addended by: Erven Colla on: 08/11/2020 10:38 AM   Modules accepted: Orders

## 2020-08-11 NOTE — Telephone Encounter (Signed)
Pt returned call. Pt states that he is coming to see [place]. He has an appt on 09/05/20 at 11 am. Pt states he would like to be sooner. Informed front to put him on recall list. Pt states he is having issues with his knees. Please advise. Thank you

## 2020-08-11 NOTE — Telephone Encounter (Signed)
Pt contacted and verbalized understanding. Pt will discuss at upcoming appt

## 2020-08-14 DIAGNOSIS — Z6841 Body Mass Index (BMI) 40.0 and over, adult: Secondary | ICD-10-CM | POA: Insufficient documentation

## 2020-08-14 DIAGNOSIS — E559 Vitamin D deficiency, unspecified: Secondary | ICD-10-CM | POA: Insufficient documentation

## 2020-08-14 DIAGNOSIS — R0602 Shortness of breath: Secondary | ICD-10-CM | POA: Insufficient documentation

## 2020-08-14 DIAGNOSIS — R5383 Other fatigue: Secondary | ICD-10-CM | POA: Insufficient documentation

## 2020-08-14 DIAGNOSIS — I10 Essential (primary) hypertension: Secondary | ICD-10-CM | POA: Insufficient documentation

## 2020-08-17 ENCOUNTER — Other Ambulatory Visit: Payer: Self-pay

## 2020-08-17 ENCOUNTER — Encounter (INDEPENDENT_AMBULATORY_CARE_PROVIDER_SITE_OTHER): Payer: Self-pay | Admitting: Family Medicine

## 2020-08-17 ENCOUNTER — Ambulatory Visit (INDEPENDENT_AMBULATORY_CARE_PROVIDER_SITE_OTHER): Payer: 59 | Admitting: Family Medicine

## 2020-08-17 VITALS — BP 136/82 | HR 93 | Temp 98.0°F | Ht 72.0 in | Wt 296.0 lb

## 2020-08-17 DIAGNOSIS — E782 Mixed hyperlipidemia: Secondary | ICD-10-CM | POA: Diagnosis not present

## 2020-08-17 DIAGNOSIS — R7303 Prediabetes: Secondary | ICD-10-CM | POA: Diagnosis not present

## 2020-08-17 DIAGNOSIS — Z9189 Other specified personal risk factors, not elsewhere classified: Secondary | ICD-10-CM | POA: Diagnosis not present

## 2020-08-17 DIAGNOSIS — Z6841 Body Mass Index (BMI) 40.0 and over, adult: Secondary | ICD-10-CM

## 2020-08-17 DIAGNOSIS — E559 Vitamin D deficiency, unspecified: Secondary | ICD-10-CM | POA: Diagnosis not present

## 2020-08-17 MED ORDER — VITAMIN D (ERGOCALCIFEROL) 1.25 MG (50000 UNIT) PO CAPS: 50000.0000 [IU] | ORAL_CAPSULE | ORAL | 0 refills | Status: DC

## 2020-08-17 MED ORDER — METFORMIN HCL 500 MG PO TABS: 500.0000 mg | ORAL_TABLET | Freq: Every morning | ORAL | 0 refills | Status: DC
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
## 2020-08-18 ENCOUNTER — Encounter: Payer: Self-pay | Admitting: Nurse Practitioner

## 2020-08-18 ENCOUNTER — Ambulatory Visit: Payer: 59 | Admitting: Nurse Practitioner

## 2020-08-18 VITALS — BP 134/82 | HR 95 | Temp 97.6°F | Ht 72.0 in | Wt 298.0 lb

## 2020-08-18 DIAGNOSIS — M25562 Pain in left knee: Secondary | ICD-10-CM | POA: Diagnosis not present

## 2020-08-18 MED ORDER — NAPROXEN 500 MG PO TABS: 500.0000 mg | ORAL_TABLET | Freq: Two times a day (BID) | ORAL | 0 refills | Status: DC

## 2020-08-18 NOTE — Patient Instructions (Signed)
Alternate ice and heat to tailbone, knees and hips. Discussed the gel the patient has used, continue using if helpful. Try lidocaine patches. These can be found at any drugstore. Place these on the affected areas. Start taking aleve. Don't  take ibuprofen, advil, or goody powders.
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
## 2020-08-19 ENCOUNTER — Encounter: Payer: Self-pay | Admitting: Nurse Practitioner

## 2020-08-19 NOTE — Progress Notes (Signed)
   Subjective:    Patient ID: Harold Trujillo, male    DOB: 16-Feb-1961, 59 y.o.   MRN: [id number]  HPI    Review of Systems     Objective:   Physical Exam        Assessment & Plan:

## 2020-08-20 NOTE — Progress Notes (Signed)
Chief Complaint:   OBESITY Harold Trujillo is here to discuss his progress with his obesity treatment plan along with follow-up of his obesity related diagnoses. Harold Trujillo is on the Category 4 Plan and states he is following his eating plan approximately 100% of the time. Harold Trujillo states he is doing 0 minutes 0 times per week.  Today's visit was #: 2 Starting weight: 306 lbs Starting date: 08/03/2020 Today's weight: 296 lbs Today's date: 08/17/2020 Total lbs lost to date: 10 Total lbs lost since last in-office visit: 10  Interim History: Harold Trujillo has doen well with weight loss on his Category 4 plan. His hunger was mostly controlled and he already feels less bloated and has increased energy. He is getting good support at home.  Subjective:   1. Pre-diabetes Harold Trujillo has been on metformin in the past, and he notes mild polyphagia on his meal plan. Last A1c and insulin were elevated. I discussed labs with the patient today.  2. Mixed hyperlipidemia Harold Trujillo is not on statin, and he is working on diet and exercise. He denies chest pain. I discussed labs with the patient today.  3. Vitamin D deficiency Harold Trujillo is on Vit D OTC, but his level is still low. He notes fatigue. I discussed labs with the patient today.  4. At risk for heart disease Harold Trujillo is at a higher than average risk for cardiovascular disease due to obesity.   Assessment/Plan:   1. Pre-diabetes Harold Trujillo will continue to work on weight loss, exercise, and decreasing simple carbohydrates to help decrease the risk of diabetes. Harold Trujillo agreed to start metformin 500 mg q AM with no refills.  - metFORMIN (GLUCOPHAGE) 500 MG tablet; Take 1 tablet (500 mg total) by mouth every morning.  Dispense: 30 tablet; Refill: 0  2. Mixed hyperlipidemia Cardiovascular risk and specific lipid/LDL goals reviewed. We discussed several lifestyle modifications today. Harold Trujillo will continue to work on diet, exercise and weight loss efforts. We will  recheck labs in 3 months. Orders and follow up as documented in patient record.   3. Vitamin D deficiency Low Vitamin D level contributes to fatigue and are associated with obesity, breast, and colon cancer. Harold Trujillo agreed to continue OTC Vit D, and he agreed to start prescription Vitamin D 50,000 IU every week with no refills. He will follow-up for routine testing of Vitamin D, at least 2-3 times per year to avoid over-replacement.  - Vitamin D, Ergocalciferol, (DRISDOL) 1.25 MG (50000 UNIT) CAPS capsule; Take 1 capsule (50,000 Units total) by mouth every 7 (seven) days.  Dispense: 4 capsule; Refill: 0  4. At risk for heart disease Harold Trujillo was given approximately 30 minutes of coronary artery disease prevention counseling today. He is 59 y.o. male and has risk factors for heart disease including obesity. We discussed intensive lifestyle modifications today with an emphasis on specific weight loss instructions and strategies.   Repetitive spaced learning was employed today to elicit superior memory formation and behavioral change.  5. Class 3 severe obesity with serious comorbidity and body mass index (BMI) of 40.0 to 44.9 in adult, unspecified obesity type (HCC) Harold Trujillo is currently in the action stage of change. As such, his goal is to continue with weight loss efforts. He has agreed to the Category 4 Plan.   Behavioral modification strategies: increasing lean protein intake and meal planning and cooking strategies.  Harold Trujillo has agreed to follow-up with our clinic in 2 weeks with Dr. Jearld Trujillo. He was informed of the importance of frequent follow-up visits  to maximize his success with intensive lifestyle modifications for his multiple health conditions.   Objective:   Blood pressure 136/82, pulse 93, temperature 98 F (36.7 C), height 6' (1.829 m), weight 296 lb (134.3 kg), SpO2 96 %. Body mass index is 40.14 kg/m.  General: Cooperative, alert, well developed, in no acute distress. HEENT:  Conjunctivae and lids unremarkable. Cardiovascular: Regular rhythm.  Lungs: Normal work of breathing. Neurologic: No focal deficits.   Lab Results  Component Value Date   CREATININE 1.15 08/03/2020   BUN 10 08/03/2020   NA 139 08/03/2020   K 4.7 08/03/2020   CL 101 08/03/2020   CO2 20 08/03/2020   Lab Results  Component Value Date   ALT 44 08/03/2020   AST 34 08/03/2020   ALKPHOS 69 08/03/2020   BILITOT 0.3 08/03/2020   Lab Results  Component Value Date   HGBA1C 5.8 (H) 08/03/2020   Lab Results  Component Value Date   INSULIN 48.8 (H) 08/03/2020   Lab Results  Component Value Date   TSH 2.420 08/03/2020   Lab Results  Component Value Date   CHOL 209 (H) 08/03/2020   HDL 39 (L) 08/03/2020   LDLCALC 142 (H) 08/03/2020   TRIG 157 (H) 08/03/2020   CHOLHDL 5.4 (H) 08/03/2020   Lab Results  Component Value Date   WBC 7.6 08/03/2020   HGB 15.6 08/03/2020   HCT 50.4 08/03/2020   MCV 76 (L) 08/03/2020   PLT 304 08/03/2020   No results found for: IRON, TIBC, FERRITIN  Attestation Statements:   Reviewed by clinician on day of visit: allergies, medications, problem list, medical history, surgical history, family history, social history, and previous encounter notes.   I, Shed Nixon, am acting as transcriptionist for Dennard Nip, MD.  I have reviewed the above documentation for accuracy and completeness, and I agree with the above. -  Dennard Nip, MD

## 2020-08-21 ENCOUNTER — Ambulatory Visit (HOSPITAL_COMMUNITY)
Admission: RE | Admit: 2020-08-21 | Discharge: 2020-08-21 | Disposition: A | Discharge status: Home / Self Care | Payer: 59 | Source: Ambulatory Visit | Attending: Nurse Practitioner | Admitting: Nurse Practitioner

## 2020-08-21 ENCOUNTER — Other Ambulatory Visit: Payer: Self-pay

## 2020-08-21 ENCOUNTER — Encounter: Payer: Self-pay | Admitting: Nurse Practitioner

## 2020-08-21 DIAGNOSIS — M25562 Pain in left knee: Secondary | ICD-10-CM | POA: Diagnosis not present

## 2020-08-22 ENCOUNTER — Telehealth (INDEPENDENT_AMBULATORY_CARE_PROVIDER_SITE_OTHER): Payer: 59 | Admitting: Psychology

## 2020-09-05 ENCOUNTER — Ambulatory Visit (INDEPENDENT_AMBULATORY_CARE_PROVIDER_SITE_OTHER): Payer: 59 | Admitting: Family Medicine

## 2020-09-05 ENCOUNTER — Other Ambulatory Visit: Payer: Self-pay

## 2020-09-05 ENCOUNTER — Encounter: Payer: Self-pay | Admitting: Family Medicine

## 2020-09-05 VITALS — BP 132/86 | HR 96 | Temp 97.9°F | Wt 293.2 lb

## 2020-09-05 DIAGNOSIS — F411 Generalized anxiety disorder: Secondary | ICD-10-CM | POA: Diagnosis not present

## 2020-09-05 DIAGNOSIS — Z79899 Other long term (current) drug therapy: Secondary | ICD-10-CM

## 2020-09-05 DIAGNOSIS — M25562 Pain in left knee: Secondary | ICD-10-CM

## 2020-09-05 DIAGNOSIS — G4700 Insomnia, unspecified: Secondary | ICD-10-CM | POA: Diagnosis not present

## 2020-09-05 DIAGNOSIS — Z23 Encounter for immunization: Secondary | ICD-10-CM

## 2020-09-05 MED ORDER — ALPRAZOLAM 0.5 MG PO TABS: ORAL_TABLET | ORAL | 0 refills | Status: DC

## 2020-09-05 MED ORDER — TRAZODONE HCL 50 MG PO TABS: ORAL_TABLET | ORAL | 2 refills | Status: DC

## 2020-09-05 NOTE — Patient Instructions (Signed)
Xanax-Take 1/2 tab p.o. for 1 wk, then take 1/2 tab p.o. every other day. Then just use as needed for rest of tablets.  Start the trazodone 50mg  at night.

## 2020-09-05 NOTE — Progress Notes (Signed)
Patient ID: Harold Trujillo, male    DOB: Oct 24, 1961, 59 y.o.   MRN: [id number]   Chief Complaint  Patient presents with  . Anxiety  . Insomnia  . Knee Pain    left   Subjective:    HPI  Left knee pain- Pt seen in past with NP Harold Trujillo last month. 08/21/20 xray- loose bodies in anterior joint space. Popping with dull pain in medial.  occ sharp pain. Left knee pain- wanting referral to ortho. Had xray and showing loose bodies in joint space. Taking naproxen prn.  Anxiety- Pt has been on xanax for 27 yrs. Had depression in his 74s.  Lots of life issues back then.  Now not filling regularly taking 1 at night. Pt stating not taking during the day. Tried ambien, waking up and had grogginess and tried melatonin. Has been on and off paxil for yrs. Then been off for 6 yrs, then restarted.  When having more pressure and stress then went back on it. Slowly weaning off it.  Metformin and taking well. And lost 18 lbs and with program 1 mo. And working on losing weight.  Medical History Harold Trujillo has a past medical history of ADHD (attention deficit hyperactivity disorder), Alcohol abuse, Anxiety, Anxiety, Back pain, Depression, Depression, Gallbladder problem, High cholesterol, Hypertension, Insomnia, Joint pain, Pre-diabetes, Sleep apnea, Sleep apnea, SOB (shortness of breath), and Vitamin D deficiency.   Outpatient Encounter Medications as of 09/05/2020  Medication Sig  . ALPRAZolam (XANAX) 0.5 MG tablet Take 1 tab p.o. qhs for 7 days, then take 1 tab p.o. qod for 1 wk. Then discontinue.  Marland Kitchen anastrozole (ARIMIDEX) 1 MG tablet Take 0.5 mg by mouth 2 (two) times a week.  Marland Kitchen buPROPion (WELLBUTRIN XL) 300 MG 24 hr tablet Take one tablet po every morning  . metFORMIN (GLUCOPHAGE) 500 MG tablet Take 1 tablet (500 mg total) by mouth every morning.  . naproxen (NAPROSYN) 500 MG tablet Take 1 tablet (500 mg total) by mouth 2 (two) times daily with a meal. PRN pain  . PARoxetine (PAXIL) 10 MG  tablet Take one tablet po every morning  . sildenafil (REVATIO) 20 MG tablet SMARTSIG:2-4 Tablet(s) By Mouth PRN  . testosterone cypionate (DEPOTESTOSTERONE CYPIONATE) 200 MG/ML injection Inject into the muscle.  . traZODone (DESYREL) 50 MG tablet Take 1-2 tab p.o. qhs prn insomnia  . [DISCONTINUED] ALPRAZolam (XANAX) 1 MG tablet Take one half to one tablet at bedtime prn  . [DISCONTINUED] Vitamin D, Ergocalciferol, (DRISDOL) 1.25 MG (50000 UNIT) CAPS capsule Take 1 capsule (50,000 Units total) by mouth every 7 (seven) days.   No facility-administered encounter medications on file as of 09/05/2020.     Review of Systems  Constitutional: Negative for chills and fever.  HENT: Negative for congestion, rhinorrhea and sore throat.   Respiratory: Negative for cough, shortness of breath and wheezing.   Cardiovascular: Negative for chest pain and leg swelling.  Gastrointestinal: Negative for abdominal pain, diarrhea, nausea and vomiting.  Genitourinary: Negative for dysuria and frequency.  Musculoskeletal:       +left knee pain  Skin: Negative for rash.  Neurological: Negative for dizziness, weakness and headaches.  Psychiatric/Behavioral: Positive for sleep disturbance. Negative for behavioral problems, decreased concentration, dysphoric mood and suicidal ideas. The patient is nervous/anxious.      Vitals BP 132/86   Pulse 96   Temp 97.9 F (36.6 C)   Wt 293 lb 3.2 oz (133 kg)   SpO2 96%   BMI 39.77 kg/m  Objective:   Physical Exam Vitals and nursing note reviewed.  Constitutional:      Appearance: Normal appearance.  HENT:     Head: Normocephalic.     Nose: Nose normal. No congestion.     Mouth/Throat:     Mouth: Mucous membranes are moist.     Pharynx: No oropharyngeal exudate.  Eyes:     Extraocular Movements: Extraocular movements intact.     Conjunctiva/sclera: Conjunctivae normal.     Pupils: Pupils are equal, round, and reactive to light.  Cardiovascular:     Rate  and Rhythm: Normal rate and regular rhythm.     Pulses: Normal pulses.     Heart sounds: Normal heart sounds. No murmur heard.   Pulmonary:     Effort: Pulmonary effort is normal.     Breath sounds: Normal breath sounds. No wheezing, rhonchi or rales.  Musculoskeletal:        General: Normal range of motion.     Right lower leg: No edema.     Left lower leg: No edema.  Skin:    General: Skin is warm and dry.     Findings: No rash.  Neurological:     General: No focal deficit present.     Mental Status: He is alert and oriented to person, place, and time.     Cranial Nerves: No cranial nerve deficit.  Psychiatric:        Mood and Affect: Mood normal.        Behavior: Behavior normal.        Thought Content: Thought content normal.        Judgment: Judgment normal.      Assessment and Plan   1. Insomnia, unspecified type - ALPRAZolam (XANAX) 0.5 MG tablet; Take 1 tab p.o. qhs for 7 days, then take 1 tab p.o. qod for 1 wk. Then discontinue.  Dispense: 15 tablet; Refill: 0 - traZODone (DESYREL) 50 MG tablet; Take 1-2 tab p.o. qhs prn insomnia  Dispense: 45 tablet; Refill: 2  2. Generalized anxiety disorder - ALPRAZolam (XANAX) 0.5 MG tablet; Take 1 tab p.o. qhs for 7 days, then take 1 tab p.o. qod for 1 wk. Then discontinue.  Dispense: 15 tablet; Refill: 0  3. Need for vaccination  4. Long-term use of high-risk medication - ToxASSURE Select 13 (MW), Urine  5. Medial knee pain, left - Ambulatory referral to Orthopedic Surgery    Discussed with pt the safety concerns of long term use of benzodiazepines. Pt willing to try alternative for insomnia.  Advised pt that I don't treat insomnia with long term benzodiazepines due to safety concerns of long term use can lead to early memory loss, dementia or increased risk of falls.  Pt voiced understanding.  Recommending trial of trazodone.  Anxiety- pt on wellbutrin and wanting to taper off the paxil pt stating only taking 5mg   paxil daily.  Pt is continuing with wellbutrin.  Pt requesting referral to ortho for the left knee pain that is continuing.    Gave tapering dose of xanax and trial of trazodone. Gave instructions on how to taper off xanax.  F/u 3 months for insomnia/anxiety.

## 2020-09-06 ENCOUNTER — Encounter (INDEPENDENT_AMBULATORY_CARE_PROVIDER_SITE_OTHER): Payer: Self-pay | Admitting: Family Medicine

## 2020-09-06 ENCOUNTER — Ambulatory Visit (INDEPENDENT_AMBULATORY_CARE_PROVIDER_SITE_OTHER): Payer: 59 | Admitting: Family Medicine

## 2020-09-06 ENCOUNTER — Other Ambulatory Visit: Payer: Self-pay

## 2020-09-06 VITALS — BP 144/77 | HR 76 | Temp 98.1°F | Ht 72.0 in | Wt 288.0 lb

## 2020-09-06 DIAGNOSIS — E559 Vitamin D deficiency, unspecified: Secondary | ICD-10-CM

## 2020-09-06 DIAGNOSIS — Z9189 Other specified personal risk factors, not elsewhere classified: Secondary | ICD-10-CM | POA: Diagnosis not present

## 2020-09-06 DIAGNOSIS — R030 Elevated blood-pressure reading, without diagnosis of hypertension: Secondary | ICD-10-CM

## 2020-09-06 DIAGNOSIS — R7303 Prediabetes: Secondary | ICD-10-CM | POA: Diagnosis not present

## 2020-09-06 DIAGNOSIS — Z6839 Body mass index (BMI) 39.0-39.9, adult: Secondary | ICD-10-CM

## 2020-09-06 MED ORDER — VITAMIN D (ERGOCALCIFEROL) 1.25 MG (50000 UNIT) PO CAPS: 50000.0000 [IU] | ORAL_CAPSULE | ORAL | 0 refills | Status: DC

## 2020-09-06 NOTE — Progress Notes (Signed)
Chief Complaint:   OBESITY Harold Trujillo is here to discuss his progress with his obesity treatment plan along with follow-up of his obesity related diagnoses. Harold Trujillo is on Harold Category 4 Plan and states he is following his eating plan approximately 80% of Harold time. Harold Trujillo states he is doing 0 minutes 0 times per week.  Today's visit was #: 3 Starting weight: 306 lbs Starting date: 08/03/2020 Today's weight: 288 lbs Today's date: 09/06/2020 Total lbs lost to date: 18 Total lbs lost since last in-office visit: 8  Interim History: Harold Trujillo is on Category 4, and he is doing well but he wants to mix it up. He is trying to make smarter choices when eating out. He ran out of food last week and so  He ended up realizing he needed more ready made foods. He doesn't think he is eating enough, and often missing at least 1 component of each meal. He is not really using snack calories.  Subjective:   1. Pre-diabetes Harold Trujillo last A1c was 5.8 and insulin 48.8. He is on metformin daily, and he denies cravings.  2. Elevated blood pressure reading Harold Trujillo blood pressure is elevated today but was previously controlled. He is not on medications.  3. Vitamin D deficiency Harold Trujillo denies nausea, vomiting, or muscle weakness, but he notes fatigue. He is on prescription Vit D. Last Vit D level was 19.5.  4. At risk for deficient intake of food Harold Trujillo is at a higher than average risk of deficient intake of food due to not eating all of Harold food on Harold plan.  Assessment/Plan:   1. Pre-diabetes Harold Trujillo will continue metformin, no refill needed. He will continue to work on weight loss, exercise, and decreasing simple carbohydrates to help decrease Harold risk of diabetes.   2. Elevated blood pressure reading Harold Trujillo will continue working on healthy weight loss and exercise to improve blood pressure control. We will watch for signs of hypotension as he continues his lifestyle modifications. We will follow  up on his blood pressure at his next appointment.  3. Vitamin D deficiency Low Vitamin D level contributes to fatigue and are associated with obesity, breast, and colon cancer. We will refill prescription Vitamin D for 1 month. Harold Trujillo will follow-up for routine testing of Vitamin D, at least 2-3 times per year to avoid over-replacement.  - Vitamin D, Ergocalciferol, (DRISDOL) 1.25 MG (50000 UNIT) CAPS capsule; Take 1 capsule (50,000 Units total) by mouth every 7 (seven) days.  Dispense: 4 capsule; Refill: 0  4. At risk for deficient intake of food Harold Trujillo was given approximately 15 minutes of deficit intake of food prevention counseling today. Harold Trujillo is at risk for eating too few calories based on current food recall. He was encouraged to focus on meeting caloric and protein goals according to his recommended meal plan.   5. Class 2 severe obesity with serious comorbidity and body mass index (BMI) of 39.0 to 39.9 in adult, unspecified obesity type (HCC) Harold Trujillo is currently in Harold action stage of change. As such, his goal is to continue with weight loss efforts. He has agreed to Harold Category 4 Plan.   Exercise goals: No exercise has been prescribed at this time.  Behavioral modification strategies: increasing lean protein intake, no skipping meals, meal planning and cooking strategies and planning for success.  Harold Trujillo has agreed to follow-up with our clinic in 2 weeks with Dr. Leafy Ro. He was informed of Harold importance of frequent follow-up visits to maximize his success  with intensive lifestyle modifications for his multiple health conditions.   Objective:   Blood pressure (!) 144/77, pulse 76, temperature 98.1 F (36.7 C), temperature source Oral, height 6' (1.829 m), weight 288 lb (130.6 kg), SpO2 94 %. Body mass index is 39.06 kg/m.  General: Cooperative, alert, well developed, in no acute distress. HEENT: Conjunctivae and lids unremarkable. Cardiovascular: Regular rhythm.  Lungs:  Normal work of breathing. Neurologic: No focal deficits.   Lab Results  Component Value Date   CREATININE 1.15 08/03/2020   BUN 10 08/03/2020   NA 139 08/03/2020   K 4.7 08/03/2020   CL 101 08/03/2020   CO2 20 08/03/2020   Lab Results  Component Value Date   ALT 44 08/03/2020   AST 34 08/03/2020   ALKPHOS 69 08/03/2020   BILITOT 0.3 08/03/2020   Lab Results  Component Value Date   HGBA1C 5.8 (H) 08/03/2020   Lab Results  Component Value Date   INSULIN 48.8 (H) 08/03/2020   Lab Results  Component Value Date   TSH 2.420 08/03/2020   Lab Results  Component Value Date   CHOL 209 (H) 08/03/2020   HDL 39 (L) 08/03/2020   LDLCALC 142 (H) 08/03/2020   TRIG 157 (H) 08/03/2020   CHOLHDL 5.4 (H) 08/03/2020   Lab Results  Component Value Date   WBC 7.6 08/03/2020   HGB 15.6 08/03/2020   HCT 50.4 08/03/2020   MCV 76 (L) 08/03/2020   PLT 304 08/03/2020   No results found for: IRON, TIBC, FERRITIN  Attestation Statements:   Reviewed by clinician on day of visit: allergies, medications, problem list, medical history, surgical history, family history, social history, and previous encounter notes.   I, Shaft Corigliano, am acting as transcriptionist for Coralie Common, MD. I have reviewed Harold above documentation for accuracy and completeness, and I agree with Harold above. - Jinny Blossom, MD

## 2020-09-08 LAB — TOXASSURE SELECT 13 (MW), URINE

## 2020-09-18 ENCOUNTER — Ambulatory Visit: Payer: 59 | Admitting: Orthopaedic Surgery

## 2020-09-18 VITALS — Ht 72.0 in | Wt 285.0 lb

## 2020-09-18 DIAGNOSIS — M25562 Pain in left knee: Secondary | ICD-10-CM | POA: Diagnosis not present

## 2020-09-18 DIAGNOSIS — M2342 Loose body in knee, left knee: Secondary | ICD-10-CM | POA: Diagnosis not present

## 2020-09-18 MED ORDER — METHYLPREDNISOLONE ACETATE 40 MG/ML IJ SUSP
40.0000 mg | INTRAMUSCULAR | Status: AC | PRN
  Administered 2020-09-18: 40 mg via INTRA_ARTICULAR

## 2020-09-18 MED ORDER — LIDOCAINE HCL 1 % IJ SOLN
3.0000 mL | INTRAMUSCULAR | Status: AC | PRN
  Administered 2020-09-18: 3 mL

## 2020-09-18 NOTE — Addendum Note (Signed)
Addended by: Meyer Cory on: 09/18/2020 10:38 AM   Modules accepted: Orders

## 2020-09-18 NOTE — Progress Notes (Signed)
Office Visit Note   Patient: Harold Trujillo           Date of Birth: 10/26/1961           MRN: [id number] Visit Date: 09/18/2020              Requested by: Erven Colla, DO [place],  [place] [address] PCP: Erven Colla, DO   Assessment & Plan: Visit Diagnoses:  1. Acute pain of left knee   2. Loose body in knee, left     Plan: Given the acute findings in the patient's knee with a positive McMurray sign to the left medial compartment combined with loose bodies on plain film, a MRI is warranted to further assess for internal derangements of the his knee.  I did fill a steroid injection would be helpful in the left knee and he agrees to this.  I described the risk and benefits of steroid injections and he agrees to this.  He tolerated the steroid injection well.  We will see him back after the MRI of his left knee.  All questions and concerns were answered and addressed.  Follow-Up Instructions: Return in about 2 weeks (around 10/02/2020).   Orders:  No orders of the defined types were placed in this encounter.  No orders of the defined types were placed in this encounter.     Procedures: Large Joint Inj: L knee on 09/18/2020 8:45 AM Indications: diagnostic evaluation and pain Details: 22 G 1.5 in needle, superolateral approach  Arthrogram: No  Medications: 3 mL lidocaine 1 %; 40 mg methylPREDNISolone acetate 40 MG/ML Outcome: tolerated well, no immediate complications Procedure, treatment alternatives, risks and benefits explained, specific risks discussed. Consent was given by the patient. Immediately prior to procedure a time out was called to verify the correct patient, procedure, equipment, support staff and site/side marked as required. Patient was prepped and draped in the usual sterile fashion.       Clinical Data: No additional findings.   Subjective: Chief Complaint  Patient presents with  . Left Knee - Pain  The patient comes in today  for evaluation treatment of acute left knee pain with some locking catching.  He actually has plain films of his left knee that were done in September showing large loose bodies in the back and knee.  He has not had any issues ever with that knee until 2 months ago when he woke up and step down and developed acute swelling with locking catching and popping in that knee.  Again he has been completely asymptomatic before this.  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  He says ice and heat has helped with the heat helping more so.  He had problems going up and down stairs as well.  Again 2 months ago he was asymptomatic.  He is not a diabetic and denies any other acute medical issues.  He has never had surgery on the left knee.  HPI  Review of Systems He currently denies any headache, chest pain, shortness of breath, fever, chills, nausea, vomiting  Objective: Vital Signs: Ht 6' (1.829 m)   Wt 285 lb (129.3 kg)   BMI 38.65 kg/m   Physical Exam He is alert and orient x3 and in no acute distress Ortho Exam Examination of both his knees shows just slight varus malalignment more so on the right  than his left painful knee.  His left knee is slightly warm with just a mild effusion.  He has significant medial joint line tenderness and a positive McMurray's exam to the medial joint line. Specialty Comments:  No specialty comments available.  Imaging: No results found. Plain films of his left knee are independently reviewed and show maintained medial lateral compartments with patellofemoral arthritic changes.  There are several large loose bodies in the posterior aspect of the knee.  PMFS History: Patient Active Problem List   Diagnosis Date Noted  . Other fatigue 08/14/2020  . Shortness of breath on exertion 08/14/2020  . Essential hypertension 08/14/2020  . Vitamin D deficiency 08/14/2020  . Class 3 severe obesity with  serious comorbidity and body mass index (BMI) of 40.0 to 44.9 in adult ([facility]) 08/14/2020  . Prediabetes 09/18/2017  . Generalized anxiety disorder 12/12/2013  . Insomnia 12/12/2013  . Hyperlipidemia LDL goal <130 12/12/2013  . Obstructive sleep apnea 12/12/2013  . Low testosterone 12/12/2013   Past Medical History:  Diagnosis Date  . ADHD (attention deficit hyperactivity disorder)   . Alcohol abuse   . Anxiety   . Anxiety   . Back pain   . Depression   . Depression   . Gallbladder problem   . High cholesterol   . Hypertension   . Insomnia   . Joint pain   . Pre-diabetes   . Sleep apnea    no cpap use at this time  . Sleep apnea   . SOB (shortness of breath)   . Vitamin D deficiency     Family History  Problem Relation Age of Onset  . Diabetes Other   . Diabetes Mother   . Heart disease Mother   . Sudden death Mother   . Obesity Mother   . Depression Father   . Anxiety disorder Father   . Colon cancer Neg Hx     Past Surgical History:  Procedure Laterality Date  . CHOLECYSTECTOMY    . for arm surgery  forearm surgery L   . HERNIA REPAIR     Social History   Occupational History  . Occupation: Higher education careers adviser    Comment: [employer]  Tobacco Use  . Smoking status: Former Smoker    Packs/day: 1.50    Years: 20.00    Pack years: 30.00    Types: Cigarettes    Quit date: 2009    Years since quitting: 12.8  . Smokeless tobacco: Never Used  Substance and Sexual Activity  . Alcohol use: Yes    Alcohol/week: 12.0 standard drinks    Types: 12 Cans of beer per week    Comment: once a week 8-12 beers per day  . Drug use: No  . Sexual activity: Yes

## 2020-10-01 ENCOUNTER — Ambulatory Visit
Admission: RE | Admit: 2020-10-01 | Discharge: 2020-10-01 | Disposition: A | Discharge status: Home / Self Care | Payer: 59 | Source: Ambulatory Visit | Attending: Orthopaedic Surgery | Admitting: Orthopaedic Surgery

## 2020-10-01 DIAGNOSIS — M25562 Pain in left knee: Secondary | ICD-10-CM

## 2020-10-01 DIAGNOSIS — M2342 Loose body in knee, left knee: Secondary | ICD-10-CM

## 2020-10-02 ENCOUNTER — Other Ambulatory Visit: Payer: Self-pay

## 2020-10-02 ENCOUNTER — Encounter: Payer: Self-pay | Admitting: Orthopaedic Surgery

## 2020-10-02 ENCOUNTER — Ambulatory Visit: Payer: 59 | Admitting: Orthopaedic Surgery

## 2020-10-02 DIAGNOSIS — M25562 Pain in left knee: Secondary | ICD-10-CM | POA: Diagnosis not present

## 2020-10-02 DIAGNOSIS — M2342 Loose body in knee, left knee: Secondary | ICD-10-CM

## 2020-10-02 NOTE — Progress Notes (Signed)
The patient comes for follow-up after having a MRI of his left knee.  He states that the steroid injection has made his knee about 80% better.  On exam he does have a recurrent effusion of the left knee.  I was able to aspirate 30 cc of clear fluid from the knee.  I did look at the MRI myself.  The official read is not back yet.  There is definitely some edema in the medial compartment of the knee.  There is a large knee joint effusion as well.  I am not await to see with official read says.  There are certainly changes in the distal femur metaphyseal region and I will see the radiologist says about that.  He understands that we need to have this read before we proceed any further with other treatment recommendations.  I will call him with the results later today once we have these.  All question concerns were answered and addressed.

## 2020-10-03 ENCOUNTER — Encounter (INDEPENDENT_AMBULATORY_CARE_PROVIDER_SITE_OTHER): Payer: Self-pay | Admitting: Family Medicine

## 2020-10-03 ENCOUNTER — Ambulatory Visit (INDEPENDENT_AMBULATORY_CARE_PROVIDER_SITE_OTHER): Payer: 59 | Admitting: Family Medicine

## 2020-10-03 VITALS — BP 125/74 | HR 77 | Temp 98.1°F | Ht 72.0 in | Wt 279.0 lb

## 2020-10-03 DIAGNOSIS — Z9189 Other specified personal risk factors, not elsewhere classified: Secondary | ICD-10-CM | POA: Diagnosis not present

## 2020-10-03 DIAGNOSIS — R7303 Prediabetes: Secondary | ICD-10-CM

## 2020-10-03 DIAGNOSIS — Z6837 Body mass index (BMI) 37.0-37.9, adult: Secondary | ICD-10-CM | POA: Diagnosis not present

## 2020-10-03 DIAGNOSIS — F419 Anxiety disorder, unspecified: Secondary | ICD-10-CM

## 2020-10-03 MED ORDER — METFORMIN HCL 500 MG PO TABS: 500.0000 mg | ORAL_TABLET | Freq: Every morning | ORAL | 0 refills | Status: DC

## 2020-10-04 NOTE — Progress Notes (Signed)
Chief Complaint:   OBESITY Harold Trujillo is here to discuss his progress with his obesity treatment plan along with follow-up of his obesity related diagnoses. Harold Trujillo is on the Category 4 Plan and states he is following his eating plan approximately 90% of the time. Harold Trujillo states he is doing 0 minutes 0 times per week.  Today's visit was #: 4 Starting weight: 306 lbs Starting date: 08/03/2020 Today's weight: 279 lbs Today's date: 10/03/2020 Total lbs lost to date: 27 Total lbs lost since last in-office visit: 9  Interim History: Harold Trujillo continues to do well with weight loss. He eats out approximately 3 times per week, but he is making much better choices. He is limited in his exercise due to his left knee pain.  Subjective:   1. Pre-diabetes Harold Trujillo continues to very well with diet and weight loss. He notes decreased polyphagia.  2. Anxiety Harold Trujillo is taking Xanax to help with anxiety and sleep.  3. At risk for impaired metabolic function Harold Trujillo is at increased risk for impaired metabolic function due to current nutrition and muscle mass.  Assessment/Plan:   1. Pre-diabetes Harold Trujillo will continue to work on weight loss, diet, exercise, and decreasing simple carbohydrates to help decrease the risk of diabetes. We will refill metformin for 1 month.  - metFORMIN (GLUCOPHAGE) 500 MG tablet; Take 1 tablet (500 mg total) by mouth every morning.  Dispense: 30 tablet; Refill: 0  2. Anxiety Behavior modification techniques were discussed today to help Harold Trujillo deal with his anxiety. We will refer to [place] to see a Psychiatrist to evaluate and anxiety and Xanax. Orders and follow up as documented in patient record.   - Ambulatory referral to Psychiatry  3. At risk for impaired metabolic function Harold Trujillo was given approximately 15 minutes of impaired  metabolic function prevention counseling today. We discussed intensive lifestyle modifications today with an emphasis on  specific nutrition and exercise instructions and strategies.   Repetitive spaced learning was employed today to elicit superior memory formation and behavioral change.  4. Class 2 severe obesity with serious comorbidity and body mass index (BMI) of 37.0 to 37.9 in adult, unspecified obesity type (HCC) Harold Trujillo is currently in the action stage of change. As such, his goal is to continue with weight loss efforts. He has agreed to the Category 4 Plan.   Exercise goals: Continue upper body exercises was discussed for strengthening and cardio.  Behavioral modification strategies: travel eating strategies and holiday eating strategies .  Harold Trujillo has agreed to follow-up with our clinic in 4 weeks. He was informed of the importance of frequent follow-up visits to maximize his success with intensive lifestyle modifications for his multiple health conditions.   Objective:   Blood pressure 125/74, pulse 77, temperature 98.1 F (36.7 C), height 6' (1.829 m), weight 279 lb (126.6 kg), SpO2 97 %. Body mass index is 37.84 kg/m.  General: Cooperative, alert, well developed, in no acute distress. HEENT: Conjunctivae and lids unremarkable. Cardiovascular: Regular rhythm.  Lungs: Normal work of breathing. Neurologic: No focal deficits.   Lab Results  Component Value Date   CREATININE 1.15 08/03/2020   BUN 10 08/03/2020   NA 139 08/03/2020   K 4.7 08/03/2020   CL 101 08/03/2020   CO2 20 08/03/2020   Lab Results  Component Value Date   ALT 44 08/03/2020   AST 34 08/03/2020   ALKPHOS 69 08/03/2020   BILITOT 0.3 08/03/2020   Lab Results  Component Value Date   HGBA1C  5.8 (H) 08/03/2020   Lab Results  Component Value Date   INSULIN 48.8 (H) 08/03/2020   Lab Results  Component Value Date   TSH 2.420 08/03/2020   Lab Results  Component Value Date   CHOL 209 (H) 08/03/2020   HDL 39 (L) 08/03/2020   LDLCALC 142 (H) 08/03/2020   TRIG 157 (H) 08/03/2020   CHOLHDL 5.4 (H) 08/03/2020    Lab Results  Component Value Date   WBC 7.6 08/03/2020   HGB 15.6 08/03/2020   HCT 50.4 08/03/2020   MCV 76 (L) 08/03/2020   PLT 304 08/03/2020   No results found for: IRON, TIBC, FERRITIN  Attestation Statements:   Reviewed by clinician on day of visit: allergies, medications, problem list, medical history, surgical history, family history, social history, and previous encounter notes.   I, Whitten Andreoni, am acting as transcriptionist for Dennard Nip, MD.  I have reviewed the above documentation for accuracy and completeness, and I agree with the above. -  Dennard Nip, MD

## 2020-10-16 ENCOUNTER — Ambulatory Visit: Payer: 59 | Admitting: Orthopaedic Surgery

## 2020-10-16 ENCOUNTER — Encounter: Payer: Self-pay | Admitting: Orthopaedic Surgery

## 2020-10-16 ENCOUNTER — Other Ambulatory Visit: Payer: Self-pay

## 2020-10-16 DIAGNOSIS — M1712 Unilateral primary osteoarthritis, left knee: Secondary | ICD-10-CM | POA: Diagnosis not present

## 2020-10-16 DIAGNOSIS — M2342 Loose body in knee, left knee: Secondary | ICD-10-CM | POA: Diagnosis not present

## 2020-10-16 DIAGNOSIS — M25562 Pain in left knee: Secondary | ICD-10-CM | POA: Diagnosis not present

## 2020-10-16 DIAGNOSIS — F411 Generalized anxiety disorder: Secondary | ICD-10-CM

## 2020-10-16 DIAGNOSIS — G4700 Insomnia, unspecified: Secondary | ICD-10-CM

## 2020-10-16 NOTE — Progress Notes (Signed)
The patient comes in today to go over MRI of his left knee.  Since I did aspirate the knee and place a steroid injection he feels much better overall and has just minimal and mild medial joint line tenderness on that knee.  He is lost a lot of weight as well.  The MRI does show some full-thickness cartilage loss in the weightbearing aspect of the medial femoral condyle the central part.  There are some loose bodies posterior to the PCL but he has no locking catching.  There is no recurrent effusion.  He has good range of motion of that knee with only medial joint line tenderness.  At some point he may end up needing a knee replacement but right now he seems to be doing well.  I gave him a handout about hyaluronic acid to try.  I showed him quad strengthening exercises that he can continue to try and I think weight loss will help.  He will avoid high impact aerobic activities.  He can always try supplements such as glucosamine or turmeric.  We had a good and thorough discussion about his knee.  All questions and concerns were answered and addressed.  Follow-up will be as needed.  If he ends up needing another steroid he knows to let us know and also he can call us about hyaluronic acid.

## 2020-10-17 NOTE — Telephone Encounter (Signed)
Pls call pt and let him know that we tapered pt off the xanax on last visit. Pt should be taking trazodone for sleep.  And I see from his visit with the weight loss doctor, that they referred him to psychiatry for his sleep and anxiety.    Would continue with the referral to psychiatry for his anxiety.  If pt needing refill on trazodone, let us know.   Thanks,   Dr. Lovena Le

## 2020-10-31 ENCOUNTER — Ambulatory Visit (INDEPENDENT_AMBULATORY_CARE_PROVIDER_SITE_OTHER): Payer: 59 | Admitting: Family Medicine

## 2020-11-06 ENCOUNTER — Other Ambulatory Visit (INDEPENDENT_AMBULATORY_CARE_PROVIDER_SITE_OTHER): Payer: Self-pay | Admitting: Family Medicine

## 2020-11-06 ENCOUNTER — Encounter (INDEPENDENT_AMBULATORY_CARE_PROVIDER_SITE_OTHER): Payer: Self-pay | Admitting: Family Medicine

## 2020-11-06 DIAGNOSIS — E559 Vitamin D deficiency, unspecified: Secondary | ICD-10-CM

## 2020-11-13 ENCOUNTER — Encounter (INDEPENDENT_AMBULATORY_CARE_PROVIDER_SITE_OTHER): Payer: Self-pay | Admitting: Family Medicine

## 2020-11-13 ENCOUNTER — Other Ambulatory Visit: Payer: Self-pay

## 2020-11-13 ENCOUNTER — Ambulatory Visit (INDEPENDENT_AMBULATORY_CARE_PROVIDER_SITE_OTHER): Payer: 59 | Admitting: Family Medicine

## 2020-11-13 VITALS — BP 127/86 | HR 74 | Temp 97.7°F | Ht 72.0 in | Wt 277.0 lb

## 2020-11-13 DIAGNOSIS — Z6837 Body mass index (BMI) 37.0-37.9, adult: Secondary | ICD-10-CM | POA: Diagnosis not present

## 2020-11-13 DIAGNOSIS — R7303 Prediabetes: Secondary | ICD-10-CM

## 2020-11-13 DIAGNOSIS — E559 Vitamin D deficiency, unspecified: Secondary | ICD-10-CM

## 2020-11-13 DIAGNOSIS — Z9189 Other specified personal risk factors, not elsewhere classified: Secondary | ICD-10-CM | POA: Diagnosis not present

## 2020-11-13 MED ORDER — VITAMIN D (ERGOCALCIFEROL) 1.25 MG (50000 UNIT) PO CAPS: 50000.0000 [IU] | ORAL_CAPSULE | ORAL | 0 refills | Status: DC

## 2020-11-13 NOTE — Progress Notes (Signed)
Chief Complaint:   OBESITY Harold Trujillo is here to discuss his progress with his obesity treatment plan along with follow-up of his obesity related diagnoses. Harold Trujillo is on the Category 4 Plan and states he is following his eating plan approximately 70% of the time. Harold Trujillo states he is doing 0 minutes 0 times per week.  Today's visit was #: 5 Starting weight: 306 lbs Starting date: 08/03/2020 Today's weight: 277 lbs Today's date: 11/13/2020 Total lbs lost to date: 29 Total lbs lost since last in-office visit: 2  Interim History: Harold Trujillo is not exercising due to limited range of motions with his shoulder and knee arthritis. He works fixing basement systems. He has been prioritizing protein, and he reports needing to increase variety of food choices.  Subjective:   1. Vitamin D deficiency Harold Trujillo's last Vit D level is not yet at goal. He denies nausea or vomiting.  2. Pre-diabetes Harold Trujillo's last A1c was 5.8, and he is stable on metformin. He denies GI upset, and he notes decreased polyphagia. He is doing well with diet.  3. At risk for diabetes mellitus Harold Trujillo is at higher than average risk for developing diabetes due to obesity.   Assessment/Plan:   1. Vitamin D deficiency Low Vitamin D level contributes to fatigue and are associated with obesity, breast, and colon cancer. We will refill prescription Vitamin D for 1 month, and we will recheck labs at his next visit. Harold Trujillo will follow-up for routine testing of Vitamin D, at least 2-3 times per year to avoid over-replacement.  2. Pre-diabetes Harold Trujillo will continue metformin, and will continue to work on weight loss, exercise, and decreasing simple carbohydrates to help decrease the risk of diabetes. We will recheck labs at his next visit.  3. At risk for diabetes mellitus Harold Trujillo was given approximately 15 minutes of diabetes education and counseling today. We discussed intensive lifestyle modifications today with an emphasis on  weight loss as well as increasing exercise and decreasing simple carbohydrates in his diet. We also reviewed medication options with an emphasis on risk versus benefit of those discussed.   Repetitive spaced learning was employed today to elicit superior memory formation and behavioral change.  4. Class 2 severe obesity with serious comorbidity and body mass index (BMI) of 37.0 to 37.9 in adult, unspecified obesity type (HCC) Harold Trujillo is currently in the action stage of change. As such, his goal is to continue with weight loss efforts. He has agreed to the Category 4 Plan.   Harold Trujillo will work on variety and we will discuss changing diet prescription at his next visit.  Behavioral modification strategies: keeping healthy foods in the home and planning for success.  Harold Trujillo has agreed to follow-up with our clinic in 3 to 4 weeks. He was informed of the importance of frequent follow-up visits to maximize his success with intensive lifestyle modifications for his multiple health conditions.   Objective:   Blood pressure 127/86, pulse 74, temperature 97.7 F (36.5 C), height 6' (1.829 m), weight 277 lb (125.6 kg), SpO2 98 %. Body mass index is 37.57 kg/m.  General: Cooperative, alert, well developed, in no acute distress. HEENT: Conjunctivae and lids unremarkable. Cardiovascular: Regular rhythm.  Lungs: Normal work of breathing. Neurologic: No focal deficits.   Lab Results  Component Value Date   CREATININE 1.15 08/03/2020   BUN 10 08/03/2020   NA 139 08/03/2020   K 4.7 08/03/2020   CL 101 08/03/2020   CO2 20 08/03/2020   Lab Results  Component Value Date   ALT 44 08/03/2020   AST 34 08/03/2020   ALKPHOS 69 08/03/2020   BILITOT 0.3 08/03/2020   Lab Results  Component Value Date   HGBA1C 5.8 (H) 08/03/2020   Lab Results  Component Value Date   INSULIN 48.8 (H) 08/03/2020   Lab Results  Component Value Date   TSH 2.420 08/03/2020   Lab Results  Component Value Date    CHOL 209 (H) 08/03/2020   HDL 39 (L) 08/03/2020   LDLCALC 142 (H) 08/03/2020   TRIG 157 (H) 08/03/2020   CHOLHDL 5.4 (H) 08/03/2020   Lab Results  Component Value Date   WBC 7.6 08/03/2020   HGB 15.6 08/03/2020   HCT 50.4 08/03/2020   MCV 76 (L) 08/03/2020   PLT 304 08/03/2020   No results found for: IRON, TIBC, FERRITIN  Attestation Statements:   Reviewed by clinician on day of visit: allergies, medications, problem list, medical history, surgical history, family history, social history, and previous encounter notes.   I, Jazen Spraggins, am acting as transcriptionist for Dennard Nip, MD.  I have reviewed the above documentation for accuracy and completeness, and I agree with the above. -  Dennard Nip, MD

## 2020-11-21 ENCOUNTER — Ambulatory Visit (INDEPENDENT_AMBULATORY_CARE_PROVIDER_SITE_OTHER): Payer: Commercial Managed Care - PPO | Admitting: Psychologist

## 2020-11-21 DIAGNOSIS — F411 Generalized anxiety disorder: Secondary | ICD-10-CM

## 2020-11-21 DIAGNOSIS — F1010 Alcohol abuse, uncomplicated: Secondary | ICD-10-CM | POA: Diagnosis not present

## 2020-11-21 NOTE — Telephone Encounter (Signed)
No, as it is a controlled substance outside my area of expertise.

## 2020-12-07 ENCOUNTER — Ambulatory Visit: Payer: Commercial Managed Care - PPO | Admitting: Family Medicine

## 2020-12-07 ENCOUNTER — Other Ambulatory Visit: Payer: Self-pay

## 2020-12-07 VITALS — BP 122/84 | HR 88 | Temp 97.8°F | Ht 72.0 in | Wt 287.6 lb

## 2020-12-07 DIAGNOSIS — F1010 Alcohol abuse, uncomplicated: Secondary | ICD-10-CM

## 2020-12-07 DIAGNOSIS — G4700 Insomnia, unspecified: Secondary | ICD-10-CM | POA: Diagnosis not present

## 2020-12-07 DIAGNOSIS — F411 Generalized anxiety disorder: Secondary | ICD-10-CM | POA: Diagnosis not present

## 2020-12-07 DIAGNOSIS — Z79890 Hormone replacement therapy (postmenopausal): Secondary | ICD-10-CM

## 2020-12-07 DIAGNOSIS — R972 Elevated prostate specific antigen [PSA]: Secondary | ICD-10-CM | POA: Diagnosis not present

## 2020-12-07 NOTE — Progress Notes (Signed)
Patient ID: Harold Trujillo, male    DOB: [birth year], 60 y.o.   MRN: [id number]   Chief Complaint  Patient presents with  . Hyperlipidemia   Subjective:    HPI  Pt seen for f/u on elevated PSA and HLD.  [facility] for Low T. Nov '21- 3.6 psa 10/30/20. Not over the normal range, but is elevating more than usual.   11/23/20- went to [facility] clinic, pt was going to establish care with another doctor, but decided to come back to our clinic.  Pt went to see new pcp in [place], however, pt decided to come back to our clinic.  Pt was wanting xanax for sleep and per the chart has asked multiple doctors and has not been given this from other providers.   Pt wanting referral to psychiatry to try to get back on naltrexone for alcohol abuse and wanting help with sleep.  Pt has been advised from multiple providers to take trazodone or melatonin and decrease alcohol intake.   Medical History Hyun has a past medical history of ADHD (attention deficit hyperactivity disorder), Alcohol abuse, Anxiety, Anxiety, Back pain, Depression, Depression, Gallbladder problem, High cholesterol, Hypertension, Insomnia, Joint pain, Pre-diabetes, Sleep apnea, Sleep apnea, SOB (shortness of breath), and Vitamin D deficiency.   Outpatient Encounter Medications as of 12/07/2020  Medication Sig  . ALPRAZolam (XANAX) 0.5 MG tablet Take 1 tab p.o. qhs for 7 days, then take 1 tab p.o. qod for 1 wk. Then discontinue. (Patient not taking: Reported on 12/07/2020)  . anastrozole (ARIMIDEX) 1 MG tablet Take 0.5 mg by mouth 2 (two) times a week.  . metFORMIN (GLUCOPHAGE) 500 MG tablet Take 1 tablet (500 mg total) by mouth every morning.  Marland Kitchen PARoxetine (PAXIL) 10 MG tablet Take one tablet po every morning  . sildenafil (REVATIO) 20 MG tablet SMARTSIG:2-4 Tablet(s) By Mouth PRN  . testosterone cypionate (DEPOTESTOSTERONE CYPIONATE) 200 MG/ML injection Inject into the muscle.  .  traZODone (DESYREL) 50 MG tablet Take 1-2 tab p.o. qhs prn insomnia  . Vitamin D, Ergocalciferol, (DRISDOL) 1.25 MG (50000 UNIT) CAPS capsule Take 1 capsule (50,000 Units total) by mouth every 7 (seven) days.  . [DISCONTINUED] buPROPion (WELLBUTRIN XL) 300 MG 24 hr tablet Take one tablet po every morning  . [DISCONTINUED] naproxen (NAPROSYN) 500 MG tablet Take 1 tablet (500 mg total) by mouth 2 (two) times daily with a meal. PRN pain   No facility-administered encounter medications on file as of 12/07/2020.     Review of Systems  Constitutional: Negative for chills and fever.  HENT: Negative for congestion, rhinorrhea and sore throat.   Respiratory: Negative for cough, shortness of breath and wheezing.   Cardiovascular: Negative for chest pain and leg swelling.  Gastrointestinal: Negative for abdominal pain, diarrhea, nausea and vomiting.  Genitourinary: Negative for dysuria, frequency, penile pain, scrotal swelling and testicular pain.  Skin: Negative for rash.  Neurological: Negative for dizziness, weakness and headaches.     Vitals BP 122/84   Pulse 88   Temp 97.8 F (36.6 C) (Oral)   Ht 6' (1.829 m)   Wt 287 lb 9.6 oz (130.5 kg)   SpO2 98%   BMI 39.01 kg/m   Objective:   Physical Exam Vitals and nursing note reviewed.  Constitutional:      General: He is not in acute distress.    Appearance: Normal appearance. He is not ill-appearing.  HENT:     Head: Normocephalic.  Eyes:     Extraocular Movements: Extraocular movements intact.     Conjunctiva/sclera: Conjunctivae normal.     Pupils: Pupils are equal, round, and reactive to light.  Cardiovascular:     Rate and Rhythm: Normal rate and regular rhythm.     Pulses: Normal pulses.     Heart sounds: Normal heart sounds. No murmur heard.   Pulmonary:     Effort: Pulmonary effort is normal.     Breath sounds: Normal breath sounds. No wheezing, rhonchi or rales.  Musculoskeletal:        General: Normal range of  motion.     Right lower leg: No edema.     Left lower leg: No edema.  Skin:    General: Skin is warm and dry.     Findings: No rash.  Neurological:     General: No focal deficit present.     Mental Status: He is alert and oriented to person, place, and time.     Cranial Nerves: No cranial nerve deficit.  Psychiatric:        Mood and Affect: Mood normal.        Behavior: Behavior normal.        Thought Content: Thought content normal.        Judgment: Judgment normal.      Assessment and Plan   1. Generalized anxiety disorder - Ambulatory referral to Psychiatry  2. Alcohol abuse - Ambulatory referral to Psychiatry  3. Insomnia, unspecified type - Ambulatory referral to Psychiatry  4. PSA elevation - Ambulatory referral to Urology  5. 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 Taking metformin. Pt to follow up with them for pre-diabetes.  Insomnia/anxiety- wanting referral to psychiatry to address this further and work on cutting back on alcohol intake.  Has refills of trazodone, not needing refill.  Pt to continue with paxil.  Taking melatonin and 100mg  trazodone and working majority of the time. occ taking 1/2 xanax if not able to sleep. Pt stating has some left over.  Pt tapered off the wellbutrin, was told by a previous doctor this may be worsening his anxiety. Pt stating paxil is working well, taking 1 tab 10mg  daily. Pt stating has some still paxil, not needing refill.   Referral given to urology for elevation in psa. Reviewed risk of taking testosterone and elevated psa. Pt to f/u with urology.  F/u 80mo or prn.

## 2020-12-11 ENCOUNTER — Other Ambulatory Visit (INDEPENDENT_AMBULATORY_CARE_PROVIDER_SITE_OTHER): Payer: Self-pay | Admitting: Family Medicine

## 2020-12-11 DIAGNOSIS — R7303 Prediabetes: Secondary | ICD-10-CM

## 2020-12-11 NOTE — Telephone Encounter (Signed)
Last OV with Dr. Beasley 

## 2020-12-12 ENCOUNTER — Encounter (INDEPENDENT_AMBULATORY_CARE_PROVIDER_SITE_OTHER): Payer: Self-pay

## 2020-12-12 NOTE — Telephone Encounter (Signed)
Message sent to pt-CAS 

## 2020-12-13 ENCOUNTER — Ambulatory Visit (INDEPENDENT_AMBULATORY_CARE_PROVIDER_SITE_OTHER): Payer: Commercial Managed Care - PPO | Admitting: Family Medicine

## 2020-12-13 ENCOUNTER — Encounter (INDEPENDENT_AMBULATORY_CARE_PROVIDER_SITE_OTHER): Payer: Self-pay

## 2020-12-13 ENCOUNTER — Other Ambulatory Visit: Payer: Self-pay

## 2020-12-13 ENCOUNTER — Encounter (INDEPENDENT_AMBULATORY_CARE_PROVIDER_SITE_OTHER): Payer: Self-pay | Admitting: Family Medicine

## 2020-12-13 VITALS — BP 148/77 | HR 90 | Temp 98.0°F | Ht 72.0 in | Wt 279.0 lb

## 2020-12-13 DIAGNOSIS — E559 Vitamin D deficiency, unspecified: Secondary | ICD-10-CM | POA: Diagnosis not present

## 2020-12-13 DIAGNOSIS — R7303 Prediabetes: Secondary | ICD-10-CM | POA: Diagnosis not present

## 2020-12-13 DIAGNOSIS — Z9189 Other specified personal risk factors, not elsewhere classified: Secondary | ICD-10-CM | POA: Diagnosis not present

## 2020-12-13 DIAGNOSIS — G4709 Other insomnia: Secondary | ICD-10-CM | POA: Diagnosis not present

## 2020-12-13 DIAGNOSIS — Z6837 Body mass index (BMI) 37.0-37.9, adult: Secondary | ICD-10-CM

## 2020-12-13 DIAGNOSIS — F411 Generalized anxiety disorder: Secondary | ICD-10-CM | POA: Insufficient documentation

## 2020-12-13 MED ORDER — VITAMIN D (ERGOCALCIFEROL) 1.25 MG (50000 UNIT) PO CAPS: 50000.0000 [IU] | ORAL_CAPSULE | ORAL | 0 refills | Status: DC

## 2020-12-13 MED ORDER — METFORMIN HCL 500 MG PO TABS: 500.0000 mg | ORAL_TABLET | Freq: Two times a day (BID) | ORAL | 0 refills | Status: DC

## 2020-12-17 NOTE — Progress Notes (Unsigned)
Chief Complaint:   OBESITY Harold Trujillo is here to discuss his progress with his obesity treatment plan along with follow-up of his obesity related diagnoses. Harold Trujillo is on the Category 4 Plan and states he is following his eating plan approximately 50% of the time. Brettley states he is exercising 0 minutes 0 times per week.  Today's visit was #: 6 Starting weight: 306 lbs Starting date: 08/03/2020 Today's weight: 279 lbs Today's date: 12/13/2020 Total lbs lost to date: 27 lbs Total lbs lost since last in-office visit: +2 lbs Total weight loss percentage to date: -8.82%  Interim History: Harold Trujillo is happy he only gained 2 lbs over the holidays. He was recently seen by 2 PCP's in the last 2 weeks. He has been dealing with increased anxiety, which he reports is slowly improving. Harold Trujillo is not exercising.  Plan: Increase Meformin to BID. Check labs at next OV (recently had FLP, CMP at PCP OV). Increase exercise to enhance mood. Harold Trujillo is having no issus with the meal plan, but reports continued hunger, especially in the evenings.  Assessment/Plan:   Lab Orders     Hemoglobin A1c     Insulin, random     VITAMIN D 25 Hydroxy (Vit-D Deficiency, Fractures)   Meds ordered this encounter  Medications  . Vitamin D, Ergocalciferol, (DRISDOL) 1.25 MG (50000 UNIT) CAPS capsule    Sig: Take 1 capsule (50,000 Units total) by mouth every 7 (seven) days.    Dispense:  4 capsule    Refill:  0  . metFORMIN (GLUCOPHAGE) 500 MG tablet    Sig: Take 1 tablet (500 mg total) by mouth 2 (two) times daily with a meal.    Dispense:  60 tablet    Refill:  0    1. GAD (generalized anxiety disorder) Ervil was recently referred to psychiatry by his PCP for anxiety, as well as sleep and alcohol abuse. Harold Trujillo identifies as a binge drinker of Michel[employer] Ultraon weekends. Harold Trujillo is prescribed Paxil 10 mg.  Plan: Continue current treatment plan, per psychiatry/PCP. Increase activity, decrease alcohol,  counseling recommended. Also, I recommend increasing Paxil, if possible, due to weight gain.    2. Other insomnia Harold Trujillo takes trazodone earlier in the evenings to avoid "hangover" affect the next morning. He also take melatonin. He is tolerating them well and reports that they work well.  Plan: Sleep hygiene discussed with patient. Continue current treatment plan, per psychiatry/ PCP. Increase activity, decrease alcohol, counseling recommended. Also, I recommend against increasing Paxil, if possible, due to weight gain.    3. Pre-diabetes Harold Trujillo is still experiencing hunger issues, especially late afternoons and evenings.  Lab Results  Component Value Date   HGBA1C 5.8 (H) 08/03/2020   Lab Results  Component Value Date   INSULIN 48.8 (H) 08/03/2020   Lab Results  Component Value Date   NA 139 08/03/2020   K 4.7 08/03/2020   CO2 20 08/03/2020   GLUCOSE 96 08/03/2020   BUN 10 08/03/2020   CREATININE 1.15 08/03/2020   CALCIUM 9.0 08/03/2020   GFRNONAA 70 08/03/2020   GFRAA 81 08/03/2020   Plan: Check labs at next OV, as patient is not fasting today. We discussed various medications that can help with his hunger symptoms.  However at this time, patient wishes to increase Metformin to BID, up from once a day.  We discussed the possibility of starting a GLP-1 in the future as well if need be and patient will call his insurance company and  inquire about coverage for those meds and his diagnosis of prediabetes. We will discuss at next OV.  Refill with increased dose- metFORMIN (GLUCOPHAGE) 500 MG tablet; Take 1 tablet (500 mg total) by mouth 2 (two) times daily with a meal.  Dispense: 60 tablet; Refill: 0  Obtain next OV - Hemoglobin A1c - Insulin, random    4. Vitamin D deficiency Harold Trujillo's Vitamin D level was 19.5 on 08/03/2020. He is currently taking prescription vitamin D 50,000 IU each week. He denies nausea, vomiting or muscle weakness.  Ref. Range 08/03/2020 10:26  Vitamin  D, 25-Hydroxy Latest Ref Range: 30.0 - 100.0 ng/mL 19.5 (L)   Plan: Refill Vit D for 1 month, as per below. Check labs at next OV. Low Vitamin D level contributes to fatigue and are associated with obesity, breast, and colon cancer. He agrees to continue to take prescription Vitamin D @50 ,000 IU every week and will follow-up for routine testing of Vitamin D, at least 2-3 times per year to avoid over-replacement.  Refill- Vitamin D, Ergocalciferol, (DRISDOL) 1.25 MG (50000 UNIT) CAPS capsule; Take 1 capsule (50,000 Units total) by mouth every 7 (seven) days.  Dispense: 4 capsule; Refill: 0  Obtain next OV - VITAMIN D 25 Hydroxy (Vit-D Deficiency, Fractures)  5. At risk for malnutrition Harold Trujillo was given approximately 23 minutes of counseling today regarding prevention of malnutrition and ways to meet macronutrient goals..   6. Class 2 severe obesity with serious comorbidity and body mass index (BMI) of 37.0 to 37.9 in adult, unspecified obesity type (HCC) Jourden is currently in the action stage of change. As such, his goal is to continue with weight loss efforts. He has agreed to the Category 4 Plan.   Exercise goals: For substantial health benefits, adults should do at least 150 minutes (2 hours and 30 minutes) a week of moderate-intensity, or 75 minutes (1 hour and 15 minutes) a week of vigorous-intensity aerobic physical activity, or an equivalent combination of moderate- and vigorous-intensity aerobic activity. Aerobic activity should be performed in episodes of at least 10 minutes, and preferably, it should be spread throughout the week.  Behavioral modification strategies: increasing lean protein intake, decreasing liquid calories, meal planning and cooking strategies, keeping healthy foods in the home and planning for success.  Harold Trujillo has agreed to follow-up with our clinic in 2 weeks (come fasting in the morning for labs- FI, A1c, and Vit D). He was informed of the importance of frequent  follow-up visits to maximize his success with intensive lifestyle modifications for his multiple health conditions.   Objective:   Blood pressure (!) 148/77, pulse 90, temperature 98 F (36.7 C), height 6' (1.829 m), weight 279 lb (126.6 kg), SpO2 97 %. Body mass index is 37.84 kg/m.  General: Cooperative, alert, well developed, in no acute distress. HEENT: Conjunctivae and lids unremarkable. Cardiovascular: Regular rhythm.  Lungs: Normal work of breathing. Neurologic: No focal deficits.   Lab Results  Component Value Date   CREATININE 1.15 08/03/2020   BUN 10 08/03/2020   NA 139 08/03/2020   K 4.7 08/03/2020   CL 101 08/03/2020   CO2 20 08/03/2020   Lab Results  Component Value Date   ALT 44 08/03/2020   AST 34 08/03/2020   ALKPHOS 69 08/03/2020   BILITOT 0.3 08/03/2020   Lab Results  Component Value Date   HGBA1C 5.8 (H) 08/03/2020   Lab Results  Component Value Date   INSULIN 48.8 (H) 08/03/2020   Lab Results  Component  Value Date   TSH 2.420 08/03/2020   Lab Results  Component Value Date   CHOL 209 (H) 08/03/2020   HDL 39 (L) 08/03/2020   LDLCALC 142 (H) 08/03/2020   TRIG 157 (H) 08/03/2020   CHOLHDL 5.4 (H) 08/03/2020   Lab Results  Component Value Date   WBC 7.6 08/03/2020   HGB 15.6 08/03/2020   HCT 50.4 08/03/2020   MCV 76 (L) 08/03/2020   PLT 304 08/03/2020    Attestation Statements:   Reviewed by clinician on day of visit: allergies, medications, problem list, medical history, surgical history, family history, social history, and previous encounter notes.  Coral Ceo, am acting as Location manager for [employer], DO.  I have reviewed the above documentation for accuracy and completeness, and I agree with the above. Marjory Sneddon, D.O.  The [place] was signed into law in 2016 which includes the topic of electronic health records.  This provides immediate access to information in MyChart.  This includes  consultation notes, operative notes, office notes, lab results and pathology reports.  If you have any questions about what you read please let us know at your next visit so we can discuss your concerns and take corrective action if need be.  We are right here with you.

## 2020-12-19 ENCOUNTER — Ambulatory Visit (INDEPENDENT_AMBULATORY_CARE_PROVIDER_SITE_OTHER): Payer: Commercial Managed Care - PPO | Admitting: Psychologist

## 2020-12-19 DIAGNOSIS — F1010 Alcohol abuse, uncomplicated: Secondary | ICD-10-CM | POA: Diagnosis not present

## 2020-12-19 DIAGNOSIS — F411 Generalized anxiety disorder: Secondary | ICD-10-CM | POA: Diagnosis not present

## 2020-12-20 ENCOUNTER — Encounter (INDEPENDENT_AMBULATORY_CARE_PROVIDER_SITE_OTHER): Payer: Self-pay | Admitting: Family Medicine

## 2020-12-20 NOTE — Telephone Encounter (Signed)
Lisabeth Pick, please let patient know since we doubled his metformin, I recommend we continue with that treatment regimen at this time.  Tell him thank you very much for checking with his insurance and that is very useful information as we may start those medicines in the future if need be.  However, please let him know I advise only 1 medication change at a time and we will discuss this further at his follow-up office visit.  Thank you.

## 2020-12-20 NOTE — Telephone Encounter (Signed)
Dr Raliegh Scarlet, Ozempic he is talking about.

## 2021-01-01 ENCOUNTER — Encounter (INDEPENDENT_AMBULATORY_CARE_PROVIDER_SITE_OTHER): Payer: Self-pay | Admitting: Family Medicine

## 2021-01-01 ENCOUNTER — Ambulatory Visit (INDEPENDENT_AMBULATORY_CARE_PROVIDER_SITE_OTHER): Payer: Commercial Managed Care - PPO | Admitting: Family Medicine

## 2021-01-01 ENCOUNTER — Other Ambulatory Visit: Payer: Self-pay

## 2021-01-01 VITALS — BP 132/80 | HR 71 | Temp 97.4°F | Ht 72.0 in | Wt 281.0 lb

## 2021-01-01 DIAGNOSIS — E7849 Other hyperlipidemia: Secondary | ICD-10-CM | POA: Insufficient documentation

## 2021-01-01 DIAGNOSIS — Z9189 Other specified personal risk factors, not elsewhere classified: Secondary | ICD-10-CM | POA: Diagnosis not present

## 2021-01-01 DIAGNOSIS — E559 Vitamin D deficiency, unspecified: Secondary | ICD-10-CM

## 2021-01-01 DIAGNOSIS — Z6838 Body mass index (BMI) 38.0-38.9, adult: Secondary | ICD-10-CM

## 2021-01-01 DIAGNOSIS — R7303 Prediabetes: Secondary | ICD-10-CM | POA: Diagnosis not present

## 2021-01-01 MED ORDER — OZEMPIC (0.25 OR 0.5 MG/DOSE) 2 MG/1.5ML ~~LOC~~ SOPN: PEN_INJECTOR | SUBCUTANEOUS | 0 refills | Status: AC

## 2021-01-01 MED ORDER — VITAMIN D (ERGOCALCIFEROL) 1.25 MG (50000 UNIT) PO CAPS
50000.0000 [IU] | ORAL_CAPSULE | ORAL | 0 refills | Status: DC
Start: 2021-01-01 — End: 2021-01-31

## 2021-01-02 LAB — COMPREHENSIVE METABOLIC PANEL
ALT: 26 IU/L (ref 0–44)
AST: 20 IU/L (ref 0–40)
Albumin/Globulin Ratio: 2 (ref 1.2–2.2)
Albumin: 4.4 g/dL (ref 3.8–4.9)
Alkaline Phosphatase: 60 IU/L (ref 44–121)
BUN/Creatinine Ratio: 12 (ref 9–20)
BUN: 13 mg/dL (ref 6–24)
Bilirubin Total: 0.5 mg/dL (ref 0.0–1.2)
CO2: 24 mmol/L (ref 20–29)
Calcium: 9.1 mg/dL (ref 8.7–10.2)
Chloride: 101 mmol/L (ref 96–106)
Creatinine, Ser: 1.12 mg/dL (ref 0.76–1.27)
GFR calc Af Amer: 84 mL/min/{1.73_m2} (ref >59)
GFR calc non Af Amer: 73 mL/min/{1.73_m2} (ref >59)
Globulin, Total: 2.2 g/dL (ref 1.5–4.5)
Glucose: 96 mg/dL (ref 65–99)
Potassium: 4.5 mmol/L (ref 3.5–5.2)
Sodium: 140 mmol/L (ref 134–144)
Total Protein: 6.6 g/dL (ref 6.0–8.5)

## 2021-01-02 LAB — LIPID PANEL
Chol/HDL Ratio: 4.6 ratio (ref 0.0–5.0)
Cholesterol, Total: 188 mg/dL (ref 100–199)
HDL: 41 mg/dL (ref >39)
LDL Chol Calc (NIH): 115 mg/dL — ABNORMAL HIGH (ref 0–99)
Triglycerides: 180 mg/dL — ABNORMAL HIGH (ref 0–149)
VLDL Cholesterol Cal: 32 mg/dL (ref 5–40)

## 2021-01-02 NOTE — Progress Notes (Signed)
Chief Complaint:   OBESITY Harold Trujillo is here to discuss his progress with his obesity treatment plan along with follow-up of his obesity related diagnoses. Zamarian is on the Category 4 Plan and states he is following his eating plan approximately 10% of the time. Jaiyon states he is exercising 0 minutes 0 times per week.  Today's visit was #: 7 Starting weight: 306 lbs Starting date: 08/03/2020 Today's weight: 281 lbs Today's date: 01/01/2021 Total lbs lost to date: 25 lbs Total lbs lost since last in-office visit: +2 lbs Total weight loss percentage to date: -8.17%   Interim History: Harold Trujillo's PCP obtained FLP and CMP recently (within the past month), but per pt, he was not fasting and would like them repeated. He denies issues with meal plan but has difficult to "get my head in the game" after several weeks. He tells me he plans on getting back on the plan this week.  Assessment/Plan:   Orders Placed This Encounter  Procedures  . Lipid panel  . Comprehensive metabolic panel    Medications Discontinued During This Encounter  Medication Reason  . Vitamin D, Ergocalciferol, (DRISDOL) 1.25 MG (50000 UNIT) CAPS capsule Reorder  . metFORMIN (GLUCOPHAGE) 500 MG tablet      Meds ordered this encounter  Medications  . Vitamin D, Ergocalciferol, (DRISDOL) 1.25 MG (50000 UNIT) CAPS capsule    Sig: Take 1 capsule (50,000 Units total) by mouth every 7 (seven) days.    Dispense:  4 capsule    Refill:  0  . Semaglutide,0.25 or 0.5MG /DOS, (OZEMPIC, 0.25 OR 0.5 MG/DOSE,) 2 MG/1.5ML SOPN    Sig: Inject 0.25 mg into the skin once a week for 14 days, THEN 0.5 mg once a week for 14 days.    Dispense:  1.5 mL    Refill:  0       1. Pre-diabetes We doubled Metformin at last OV, but can't remember to take 2nd dose. He usually gets in 1 dose a day.  Lab Results  Component Value Date   HGBA1C 5.8 (H) 08/03/2020   Lab Results  Component Value Date   INSULIN 48.8 (H) 08/03/2020    Plan: Discontinue Metformin. Start Ozempic, taper dose. Check A1c and fasting insulin today.  2. Vitamin D deficiency Harold Trujillo's Vitamin D level was 19.5 on 08/03/2020. He is currently taking prescription vitamin D 50,000 IU each week. He denies nausea, vomiting or muscle weakness.   Ref. Range 08/03/2020 10:26  Vitamin D, 25-Hydroxy Latest Ref Range: 30.0 - 100.0 ng/mL 19.5 (L)   Plan:Check Vit D today.  Low Vitamin D level contributes to fatigue and are associated with obesity, breast, and colon cancer. He agrees to continue to take prescription Vitamin D @50 ,000 IU every week and will follow-up for routine testing of Vitamin D, at least 2-3 times per year to avoid over-replacement.  Refill- Vitamin D, Ergocalciferol, (DRISDOL) 1.25 MG (50000 UNIT) CAPS capsule; Take 1 capsule (50,000 Units total) by mouth every 7 (seven) days.  Dispense: 4 capsule; Refill: 0  3. Other hyperlipidemia Harold Trujillo has hyperlipidemia and has been trying to improve his cholesterol levels with intensive lifestyle modification including a low saturated fat diet, exercise and weight loss. He denies any chest pain, claudication or myalgias. Pt is not on statin therapy.   Ref. Range 08/03/2020 10:26  Total CHOL/HDL Ratio Latest Ref Range: 0.0 - 5.0 ratio 5.4 (H)  Cholesterol, Total Latest Ref Range: 100 - 199 mg/dL 209 (H)  HDL Cholesterol Latest Ref  Range: >39 mg/dL 39 (L)  Triglycerides Latest Ref Range: 0 - 149 mg/dL 157 (H)  VLDL Cholesterol Cal Latest Ref Range: 5 - 40 mg/dL 28  LDL Chol Calc (NIH) Latest Ref Range: 0 - 99 mg/dL 142 (H)    Plan: Check CMP and fastin lipid panel today. Pt is fasting. Continue prudent nutritional plan and increase activity. Cardiovascular risk and specific lipid/LDL goals reviewed.  We discussed several lifestyle modifications today and Harold Trujillo will continue to work on diet, exercise and weight loss efforts. Orders and follow up as documented in patient record.   Counseling Intensive  lifestyle modifications are the first line treatment for this issue. . Dietary changes: Increase soluble fiber. Decrease simple carbohydrates. . Exercise changes: Moderate to vigorous-intensity aerobic activity 150 minutes per week if tolerated. . Lipid-lowering medications: see documented in medical record.  Lab/Orders today or future: - Lipid panel - Comprehensive metabolic panel  4. At risk for side effect of medication Maxwell was given approximately 24 minutes of drug side effect counseling today.  We discussed side effect possibility and risk versus benefits. Marquavious agreed to the medication and will contact this office if these side effects are intolerable.  5. Class 2 severe obesity with serious comorbidity and body mass index (BMI) of 38.0 to 38.9 in adult, unspecified obesity type (HCC) Harold Trujillo is currently in the action stage of change. As such, his goal is to continue with weight loss efforts. He has agreed to the Category 4 Plan.   Exercise goals: All adults should avoid inactivity. Some physical activity is better than none, and adults who participate in any amount of physical activity gain some health benefits.  Behavioral modification strategies: no skipping meals, meal planning and cooking strategies, keeping healthy foods in the home, better snacking choices and planning for success.  Harold Trujillo has agreed to follow-up with our clinic in 2 weeks. He was informed of the importance of frequent follow-up visits to maximize his success with intensive lifestyle modifications for his multiple health conditions.   Harold Trujillo was informed we would discuss his lab results at his next visit unless there is a critical issue that needs to be addressed sooner. Harold Trujillo agreed to keep his next visit at the agreed upon time to discuss these results.  Objective:   Blood pressure 132/80, pulse 71, temperature (!) 97.4 F (36.3 C), height 6' (1.829 m), weight 281 lb (127.5 kg), SpO2 97 %. Body  mass index is 38.11 kg/m.  General: Cooperative, alert, well developed, in no acute distress. HEENT: Conjunctivae and lids unremarkable. Cardiovascular: Regular rhythm.  Lungs: Normal work of breathing. Neurologic: No focal deficits.   Lab Results  Component Value Date   CREATININE 1.12 01/01/2021   BUN 13 01/01/2021   NA 140 01/01/2021   K 4.5 01/01/2021   CL 101 01/01/2021   CO2 24 01/01/2021   Lab Results  Component Value Date   ALT 26 01/01/2021   AST 20 01/01/2021   ALKPHOS 60 01/01/2021   BILITOT 0.5 01/01/2021   Lab Results  Component Value Date   HGBA1C 5.8 (H) 08/03/2020   Lab Results  Component Value Date   INSULIN 48.8 (H) 08/03/2020   Lab Results  Component Value Date   TSH 2.420 08/03/2020   Lab Results  Component Value Date   CHOL 188 01/01/2021   HDL 41 01/01/2021   LDLCALC 115 (H) 01/01/2021   TRIG 180 (H) 01/01/2021   CHOLHDL 4.6 01/01/2021   Lab Results  Component Value  Date   WBC 7.6 08/03/2020   HGB 15.6 08/03/2020   HCT 50.4 08/03/2020   MCV 76 (L) 08/03/2020   PLT 304 08/03/2020   No results found for: IRON, TIBC, FERRITIN  Attestation Statements:   Reviewed by clinician on day of visit: allergies, medications, problem list, medical history, surgical history, family history, social history, and previous encounter notes.  Coral Ceo, am acting as Location manager for [employer], DO.  I have reviewed the above documentation for accuracy and completeness, and I agree with the above. Marjory Sneddon, D.O.  The [place] was signed into law in 2016 which includes the topic of electronic health records.  This provides immediate access to information in MyChart.  This includes consultation notes, operative notes, office notes, lab results and pathology reports.  If you have any questions about what you read please let us know at your next visit so we can discuss your concerns and take corrective action if  need be.  We are right here with you.

## 2021-01-08 ENCOUNTER — Other Ambulatory Visit: Payer: Self-pay

## 2021-01-08 ENCOUNTER — Ambulatory Visit (INDEPENDENT_AMBULATORY_CARE_PROVIDER_SITE_OTHER): Payer: 59 | Admitting: Urology

## 2021-01-08 ENCOUNTER — Encounter: Payer: Self-pay | Admitting: Urology

## 2021-01-08 VITALS — BP 137/85 | HR 84 | Temp 98.3°F | Ht 72.0 in | Wt 281.0 lb

## 2021-01-08 DIAGNOSIS — N5201 Erectile dysfunction due to arterial insufficiency: Secondary | ICD-10-CM

## 2021-01-08 DIAGNOSIS — R972 Elevated prostate specific antigen [PSA]: Secondary | ICD-10-CM

## 2021-01-08 DIAGNOSIS — G4700 Insomnia, unspecified: Secondary | ICD-10-CM

## 2021-01-08 DIAGNOSIS — F411 Generalized anxiety disorder: Secondary | ICD-10-CM

## 2021-01-08 LAB — URINALYSIS, ROUTINE W REFLEX MICROSCOPIC
Bilirubin, UA: NEGATIVE
Glucose, UA: NEGATIVE
Ketones, UA: NEGATIVE
Leukocytes,UA: NEGATIVE
Nitrite, UA: NEGATIVE
Protein,UA: NEGATIVE
RBC, UA: NEGATIVE
Specific Gravity, UA: 1.015 (ref 1.005–1.030)
Urobilinogen, Ur: 0.2 mg/dL (ref 0.2–1.0)
pH, UA: 7.5 (ref 5.0–7.5)

## 2021-01-08 NOTE — Patient Instructions (Signed)
Prostate Cancer Screening  Prostate cancer screening is a test that is done to check for the presence of prostate cancer in men. The prostate gland is a walnut-sized gland that is located below the bladder and in front of the rectum in males. The function of the prostate is to add fluid to semen during ejaculation. Prostate cancer is the second most common type of cancer in men. Who should have prostate cancer screening?  Screening recommendations vary based on age and other risk factors. Screening is recommended if:  You are older than age 55. If you are age 55-69, talk with your health care provider about your need for screening and how often screening should be done. Because most prostate cancers are slow growing and will not cause death, screening is generally reserved in this age group for men who have a 10-15-year life expectancy.  You are younger than age 55, and you have these risk factors: ? Being a black male or a male of African descent. ? Having a father, brother, or uncle who has been diagnosed with prostate cancer. The risk is higher if your family member's cancer occurred at an early age. Screening is not recommended if:  You are younger than age 40.  You are between the ages of 40 and 54 and you have no risk factors.  You are 70 years of age or older. At this age, the risks that screening can cause are greater than the benefits that it may provide. If you are at high risk for prostate cancer, your health care provider may recommend that you have screenings more often or that you start screening at a younger age. How is screening for prostate cancer done? The recommended prostate cancer screening test is a blood test called the prostate-specific antigen (PSA) test. PSA is a protein that is made in the prostate. As you age, your prostate naturally produces more PSA. Abnormally high PSA levels may be caused by:  Prostate cancer.  An enlarged prostate that is not caused by cancer  (benign prostatic hyperplasia, BPH). This condition is very common in older men.  A prostate gland infection (prostatitis). Depending on the PSA results, you may need more tests, such as:  A physical exam to check the size of your prostate gland.  Blood and imaging tests.  A procedure to remove tissue samples from your prostate gland for testing (biopsy). What are the benefits of prostate cancer screening?  Screening can help to identify cancer at an early stage, before symptoms start and when the cancer can be treated more easily.  There is a small chance that screening may lower your risk of dying from prostate cancer. The chance is small because prostate cancer is a slow-growing cancer, and most men with prostate cancer die from a different cause. What are the risks of prostate cancer screening? The main risk of prostate cancer screening is diagnosing and treating prostate cancer that would never have caused any symptoms or problems. This is called overdiagnosisand overtreatment. PSA screening cannot tell you if your PSA is high due to cancer or a different cause. A prostate biopsy is the only procedure to diagnose prostate cancer. Even the results of a biopsy may not tell you if your cancer needs to be treated. Slow-growing prostate cancer may not need any treatment other than monitoring, so diagnosing and treating it may cause unnecessary stress or other side effects. A prostate biopsy may also cause:  Infection or fever.  A false negative. This is   a result that shows that you do not have prostate cancer when you actually do have prostate cancer. Questions to ask your health care provider  When should I start prostate cancer screening?  What is my risk for prostate cancer?  How often do I need screening?  What type of screening tests do I need?  How do I get my test results?  What do my results mean?  Do I need treatment? Where to find more information  The American Cancer  Society: www.cancer.org  American Urological Association: www.auanet.org Contact a health care provider if:  You have difficulty urinating.  You have pain when you urinate or ejaculate.  You have blood in your urine or semen.  You have pain in your back or in the area of your prostate. Summary  Prostate cancer is a common type of cancer in men. The prostate gland is located below the bladder and in front of the rectum. This gland adds fluid to semen during ejaculation.  Prostate cancer screening may identify cancer at an early stage, when the cancer can be treated more easily.  The prostate-specific antigen (PSA) test is the recommended screening test for prostate cancer.  Discuss the risks and benefits of prostate cancer screening with your health care provider. If you are age 58 or older, the risks that screening can cause are greater than the benefits that it may provide. This information is not intended to replace advice given to you by your health care provider. Make sure you discuss any questions you have with your health care provider. Document Revised: 03/10/2020 Document Reviewed: 07/01/2019 Elsevier Patient Education  [place].

## 2021-01-08 NOTE — Progress Notes (Signed)

## 2021-01-08 NOTE — Progress Notes (Signed)
H&P -[facility] [place]  Chief Complaint: PSA elevation (age specific)  History of Present Illness:   #1 PCa screening -patient with a PSA of 0.7 in 2017.  He was seen at "[facility]" and had a PSA 07/27/2020 which was 2.7 and then 10/30/2020 of 3.6. No FH PCa. No h/o BPH. No 5ari use.   #2 erectile dysfunction-patient taking sildenafil 20 mg tablets. Working well. He did PRP injection and acoustic wave therapy x 2.   H/o low testosterone-patient reports history of low testosterone and is on testosterone cypionate injections. T levels around 800. 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   He works in Press photographer for [employer].   Past Medical History:  Diagnosis Date  . ADHD (attention deficit hyperactivity disorder)   . Alcohol abuse   . Anxiety   . Anxiety   . Back pain   . Depression   . Depression   . Gallbladder problem   . High cholesterol   . Hypertension   . Insomnia   . Joint pain   . Pre-diabetes   . Sleep apnea    no cpap use at this time  . Sleep apnea   . SOB (shortness of breath)   . Vitamin D deficiency    Past Surgical History:  Procedure Laterality Date  . CHOLECYSTECTOMY    . for arm surgery  forearm surgery L   . HERNIA REPAIR      Home Medications:  (Not in a hospital admission)  Allergies:  Allergies  Allergen Reactions  . Shellfish Allergy Nausea And Vomiting  . Penicillins Other (See Comments)    Mom said I had reaction as a baby-per pt Has patient had a PCN reaction causing immediate rash, facial/tongue/throat swelling, SOB or lightheadedness with hypotension: unknown Has patient had a PCN reaction causing severe rash involving mucus membranes or skin necrosis:unknown Has patient had a PCN reaction that required hospitalization? unknown Has patient had a PCN reaction occurring within the last 10 years: No If all of the above answers are "NO", then may proceed  with Cephalosporin use.     Family History  Problem Relation Age of Onset  . Diabetes Other   . Diabetes Mother   . Heart disease Mother   . Sudden death Mother   . Obesity Mother   . Depression Father   . Anxiety disorder Father   . Colon cancer Neg Hx    Social History:  reports that he quit smoking about 13 years ago. His smoking use included cigarettes. He has a 30.00 pack-year smoking history. He has never used smokeless tobacco. He reports current alcohol use of about 12.0 standard drinks of alcohol per week. He reports that he does not use drugs.  ROS: A complete review of systems was performed.  All systems are negative except for pertinent findings as noted. ROS   Physical Exam:  Vital signs in last 24 hours: @VSRANGES @ General:  Alert and oriented, No acute distress HEENT: Normocephalic, atraumatic Neck: No JVD or lymphadenopathy Cardiovascular: Regular rate and rhythm Lungs: Regular rate and effort Abdomen: Soft, nontender, nondistended, no abdominal masses Back: No CVA tenderness Extremities: No edema Neurologic: Grossly intact GU: prostate 30 grams, smooth without hard area or nodule    Laboratory Data:  No results found for this or any previous visit (from the past 24 hour(s)). No results found for this or any previous visit (from the  past 240 hour(s)). Creatinine: No results for input(s): CREATININE in the last 168 hours.  Impression/Assessment/plan:  1) PSA elevation - discussed PSA level too high for age, going up too fast and too high for prostate size. Check PSA today. Disc PCa management might involve AS, surg or XRT.   We also went over the nature risk, benefit of T replacement and how T related to PCa risk (usually low T related to PCa) and how T is manipulated for PCa tx.   2) ED - briefly discussed pge injections. He will consider. We can send in Rx and have him return for teaching at a later date.   PSA was sent. If remains elevated - set up  for bx. If normalized will follow and consider ED tx af f/u.   Festus Aloe 01/08/2021, 10:44 AM

## 2021-01-09 ENCOUNTER — Ambulatory Visit: Payer: Commercial Managed Care - PPO | Admitting: Psychologist

## 2021-01-09 LAB — PSA: Prostate Specific Ag, Serum: 1.9 ng/mL (ref 0.0–4.0)

## 2021-01-09 MED ORDER — ALPRAZOLAM 0.5 MG PO TABS: ORAL_TABLET | ORAL | 0 refills | Status: DC

## 2021-01-10 ENCOUNTER — Telehealth: Payer: Self-pay

## 2021-01-10 NOTE — Telephone Encounter (Signed)
-----   Message from Festus Aloe, MD sent at 01/10/2021  1:07 PM EST ----- Let Octavia Bruckner know his PSA looks good. Back to normal. If he is interested, I can send in prostaglandin to [facility] and he can return to the office with me next available appt for injection teaching.  ----- Message ----- From: Iris Pert, LPN Sent: [phone]/0/[phone]  82:99 AM EST To: Festus Aloe, MD  Please review

## 2021-01-11 ENCOUNTER — Other Ambulatory Visit: Payer: Self-pay | Admitting: Urology

## 2021-01-11 NOTE — Telephone Encounter (Signed)
The doctors have to do a hard copy and we fax to them. I'm not sure why but that's the way we have to do with them. There fax number is [phone].

## 2021-01-19 ENCOUNTER — Encounter (INDEPENDENT_AMBULATORY_CARE_PROVIDER_SITE_OTHER): Payer: Self-pay | Admitting: Family Medicine

## 2021-01-22 ENCOUNTER — Ambulatory Visit (INDEPENDENT_AMBULATORY_CARE_PROVIDER_SITE_OTHER): Payer: Commercial Managed Care - PPO | Admitting: Family Medicine

## 2021-01-24 ENCOUNTER — Ambulatory Visit: Payer: Commercial Managed Care - PPO | Admitting: Psychologist

## 2021-01-24 IMAGING — MR MR KNEE*L* W/O CM
4 of 6 series · 23 of 40 positions shown · non-contrast
Comparison: Left knee x-rays dated August 21, 2020.

CLINICAL DATA: Left knee pain and mechanical symptoms for the past
6 weeks. No injury or prior surgery.

EXAM:
MRI OF THE LEFT KNEE WITHOUT CONTRAST
TECHNIQUE: Multiplanar, multisequence MR imaging of the knee was performed. No
intravenous contrast was administered.

[Series 4: T2 fat-sat · coronal · 4.0mm · 0.62mm/px · 6 of 29 slices shown (1 of 2)]
[im 1/29]
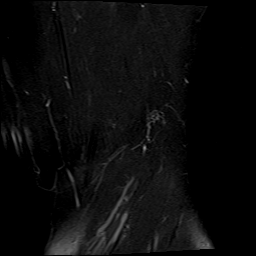
[im 6/29]
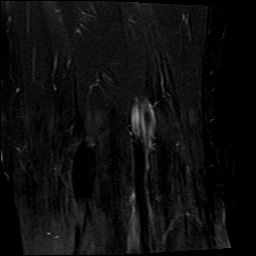
[im 12/29]
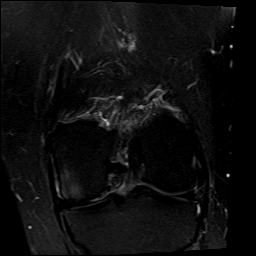
[im 17/29]
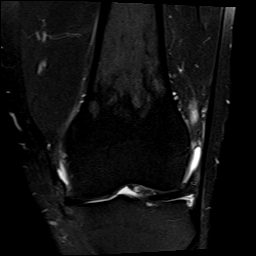
[im 23/29]
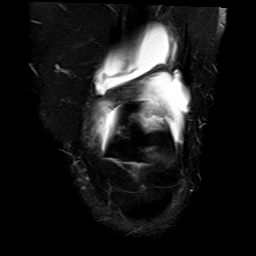
[im 29/29]
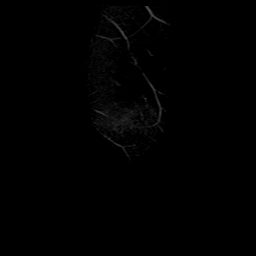

[Series 5: T1 · coronal · 4.0mm · 0.31mm/px · 3 of 29 slices shown]
[im 6/29]
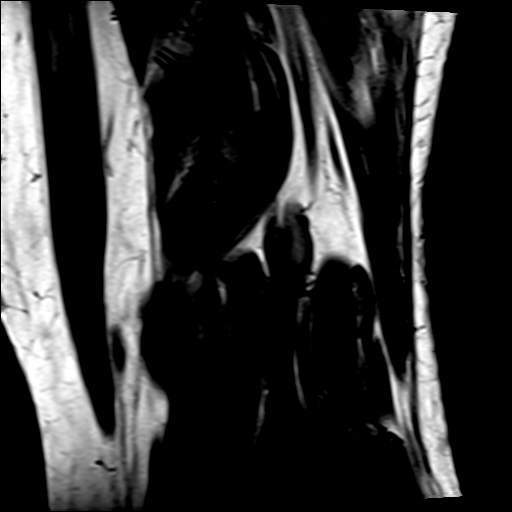
[im 17/29]
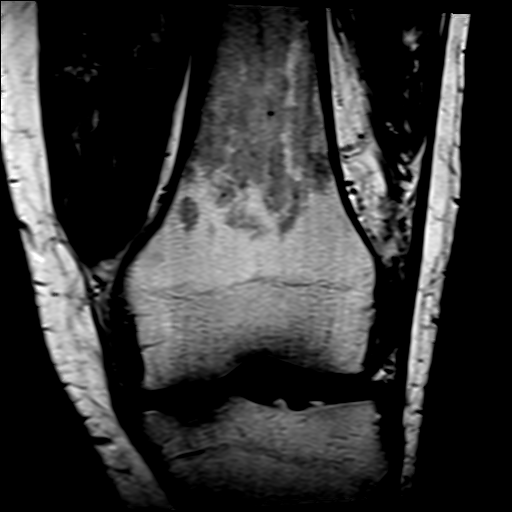
[im 29/29]
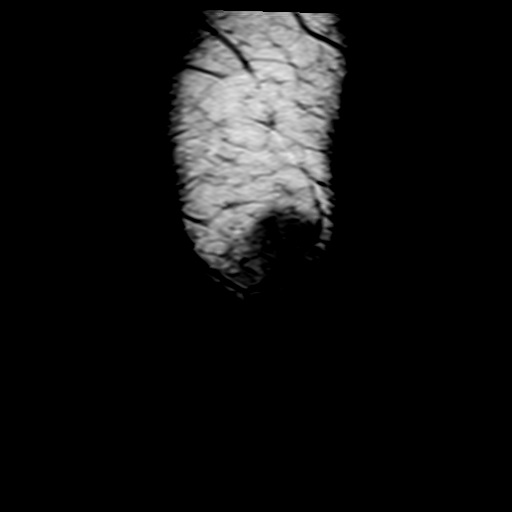

[Series 8: T2 fat-sat · sagittal · 3.0mm · 0.31mm/px · 7 of 32 slices shown (2 of 2)]
[im 1/32]
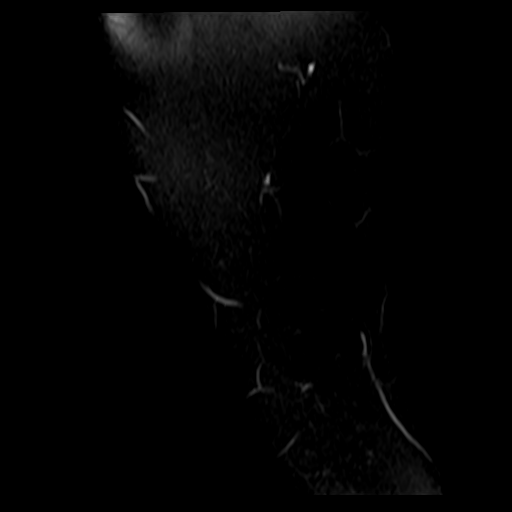
[im 6/32]
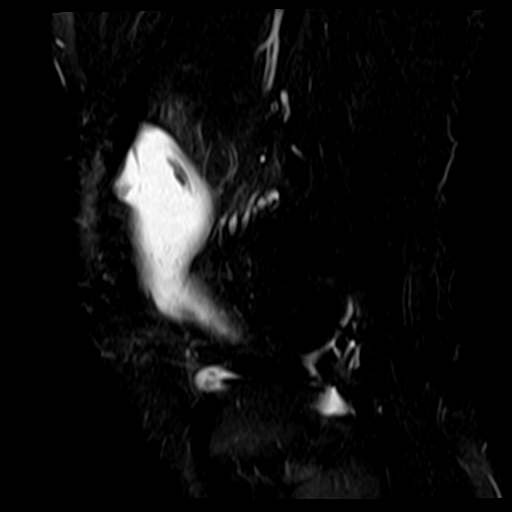
[im 11/32]
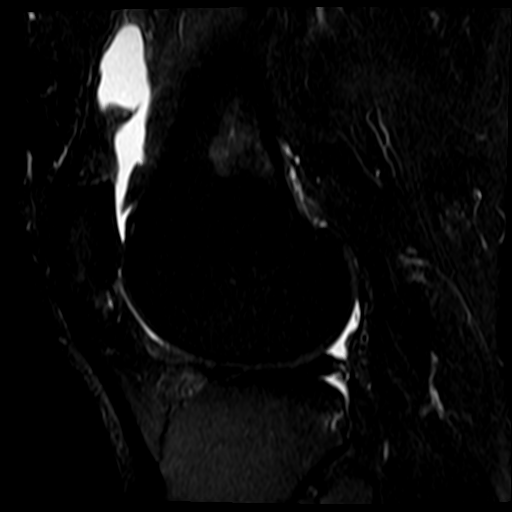
[im 16/32]
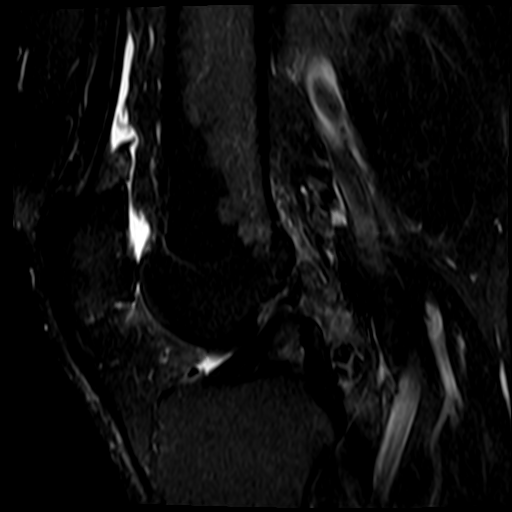
[im 21/32]
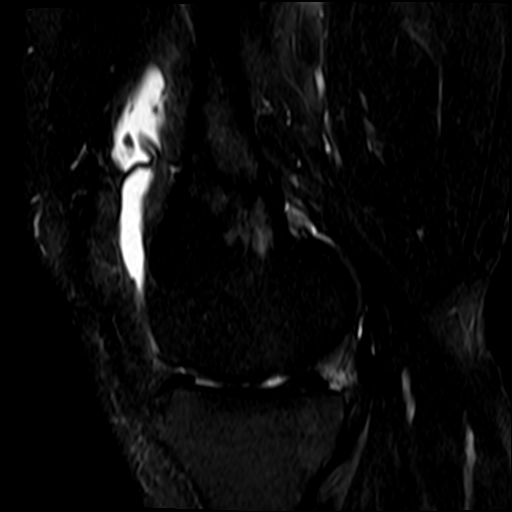
[im 26/32]
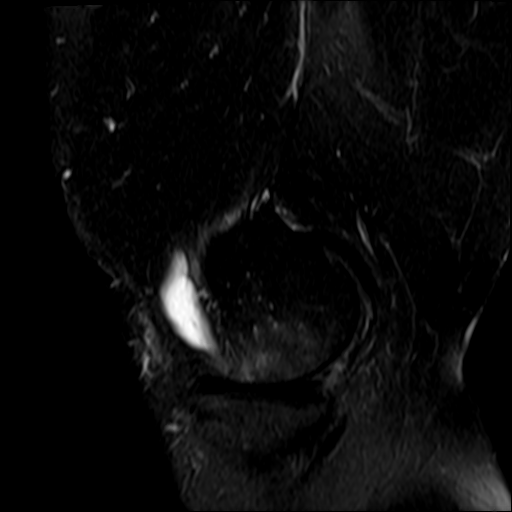
[im 32/32]
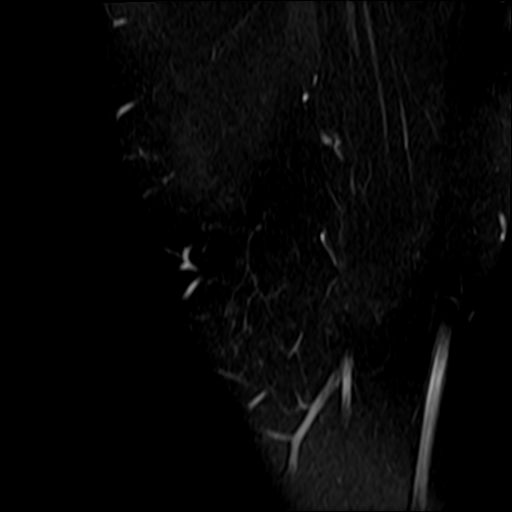

[Series 9: PD fat-sat · sagittal · 3.0mm · 0.31mm/px · 7 of 32 slices shown]
[im 1/32]
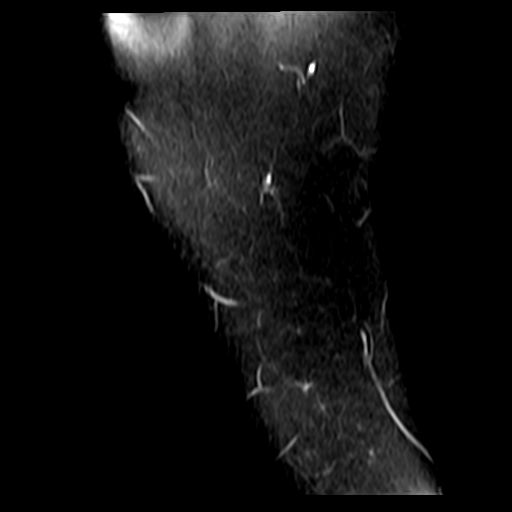
[im 6/32]
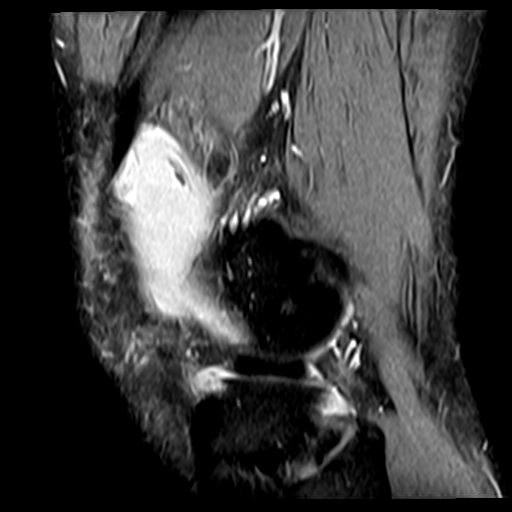
[im 11/32]
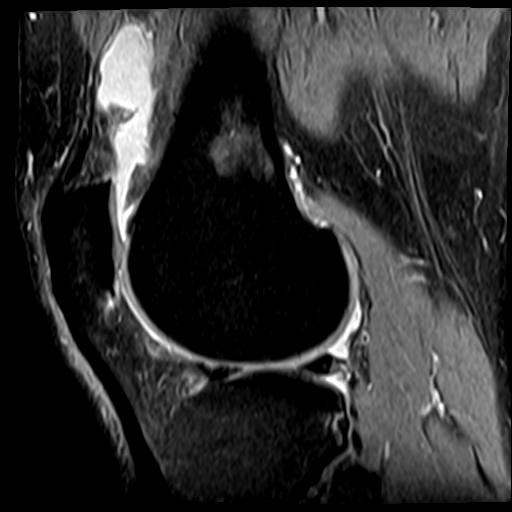
[im 16/32]
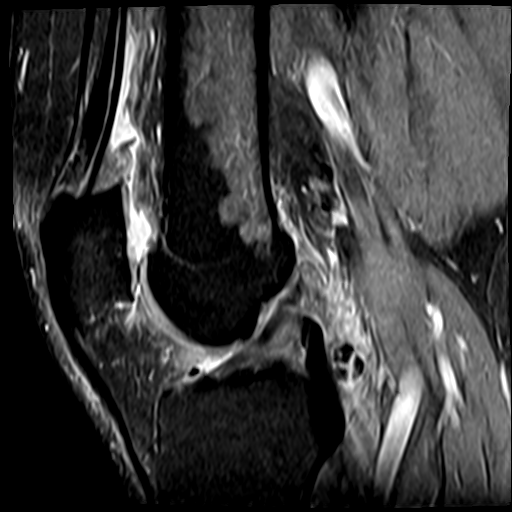
[im 21/32]
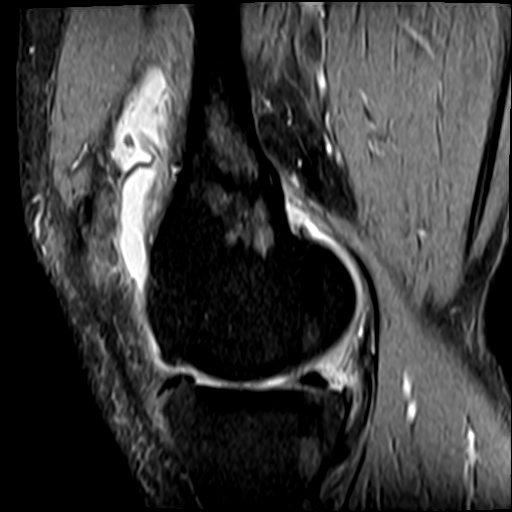
[im 26/32]
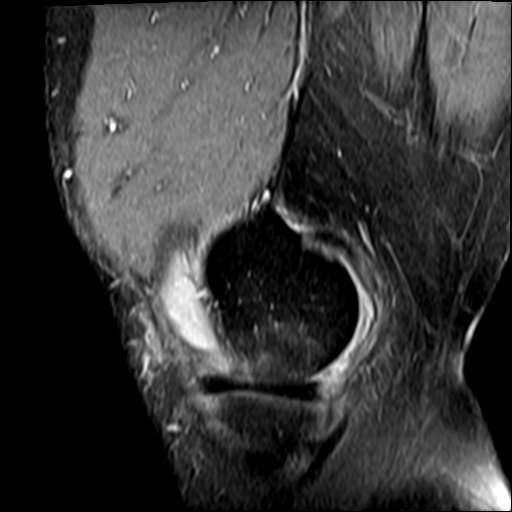
[im 32/32]
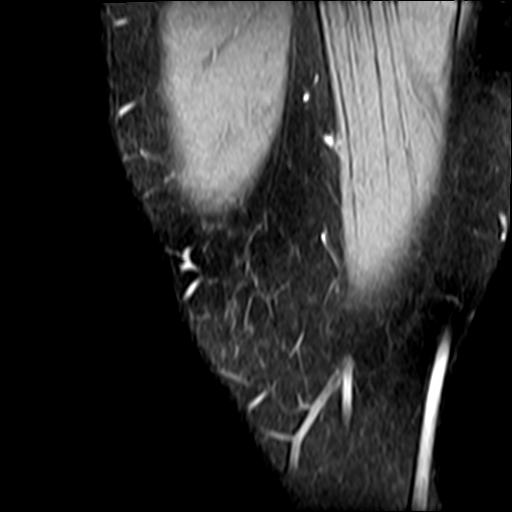

[23 of 40 positions shown; findings below may reference images not displayed]

FINDINGS: MENISCI

Medial meniscus:  Intact.

Lateral meniscus:  Intact.

LIGAMENTS

Cruciates:  Intact ACL and PCL.

Collaterals: Medial collateral ligament is intact. Lateral
collateral ligament complex is intact.

CARTILAGE

Patellofemoral: Partial and full-thickness cartilage loss with
delamination over the superior patella and subchondral marrow edema.

Medial: Full-thickness cartilage loss over the central
weight-bearing and peripheral medial femoral condyle with
subchondral marrow.

Lateral:  No chondral defect.

Joint: Small to moderate joint effusion. Several small calcified
intra-articular bodies posterior to the PCL. Normal Hoffa's fat.

Popliteal Fossa:  No Baker cyst. Intact popliteus tendon.

Extensor Mechanism: Intact quadriceps tendon and patellar tendon.
Intact medial and lateral patellar retinaculum. Intact MPFL.

Bones: No acute fracture or dislocation. No suspicious bone lesion.
Red marrow hyperplasia in the distal femoral metadiaphysis.

Other: None.
IMPRESSION: 1. No meniscal or ligamentous injury.
2. Mild to moderate medial and patellofemoral compartment
osteoarthritis.
3. Small to moderate joint effusion with several small calcified
intra-articular bodies posterior to the PCL.

## 2021-01-31 ENCOUNTER — Other Ambulatory Visit: Payer: Self-pay

## 2021-01-31 ENCOUNTER — Encounter (INDEPENDENT_AMBULATORY_CARE_PROVIDER_SITE_OTHER): Payer: Self-pay | Admitting: Family Medicine

## 2021-01-31 ENCOUNTER — Telehealth (INDEPENDENT_AMBULATORY_CARE_PROVIDER_SITE_OTHER): Payer: Commercial Managed Care - PPO | Admitting: Family Medicine

## 2021-01-31 DIAGNOSIS — R7303 Prediabetes: Secondary | ICD-10-CM

## 2021-01-31 DIAGNOSIS — Z6838 Body mass index (BMI) 38.0-38.9, adult: Secondary | ICD-10-CM

## 2021-01-31 DIAGNOSIS — E559 Vitamin D deficiency, unspecified: Secondary | ICD-10-CM

## 2021-02-01 ENCOUNTER — Encounter (INDEPENDENT_AMBULATORY_CARE_PROVIDER_SITE_OTHER): Payer: Self-pay

## 2021-02-01 MED ORDER — VITAMIN D (ERGOCALCIFEROL) 1.25 MG (50000 UNIT) PO CAPS
50000.0000 [IU] | ORAL_CAPSULE | ORAL | 0 refills | Status: DC
Start: 2021-02-01 — End: 2021-03-13

## 2021-02-01 MED ORDER — OZEMPIC (0.25 OR 0.5 MG/DOSE) 2 MG/1.5ML ~~LOC~~ SOPN: 0.2500 mg | PEN_INJECTOR | SUBCUTANEOUS | 0 refills | Status: DC

## 2021-02-01 NOTE — Progress Notes (Signed)
TeleHealth Visit:  Due to the COVID-19 pandemic, this visit was completed with telemedicine (audio/video) technology to reduce patient and provider exposure as well as to preserve personal protective equipment.   Brigham has verbally consented to this TeleHealth visit. The patient is located at home, the provider is located at the [facility] office. The participants in this visit include the listed provider and patient. The visit was conducted today via MyChart video.   Chief Complaint: OBESITY Harold Trujillo is here to discuss his progress with his obesity treatment plan along with follow-up of his obesity related diagnoses. Harold Trujillo is on the Category 4 Plan and states he is following his eating plan approximately 20% of the time. Harold Trujillo states he is doing 0 minutes 0 times per week.  Today's visit was #: 8 Starting weight: 306 lbs Starting date: 08/03/2020  Interim History: [name] feels he has maintained his weight since his last visit. He hasn't been able to follow his plan as closely, but he tried to make better choices.  Subjective:   1. Vitamin D deficiency Harold Trujillo is stable on Vit D, and he is due for labs in another month or so.  2. Pre-diabetes Harold Trujillo started Ozempic and he feels it has helped decrease polyphagia. He denies nausea or vomiting.  Assessment/Plan:   1. Vitamin D deficiency Low Vitamin D level contributes to fatigue and are associated with obesity, breast, and colon cancer. We will refill prescription Vitamin D for 1 month. Harold Trujillo will follow-up for routine testing of Vitamin D, at least 2-3 times per year to avoid over-replacement.  - Vitamin D, Ergocalciferol, (DRISDOL) 1.25 MG (50000 UNIT) CAPS capsule; Take 1 capsule (50,000 Units total) by mouth every 7 (seven) days.  Dispense: 4 capsule; Refill: 0  2. Pre-diabetes Ethridge will continue to work on weight loss, exercise, and decreasing simple carbohydrates to help decrease the risk of  diabetes. We will refill Ozempic for 1 month.   - Semaglutide,0.25 or 0.5MG /DOS, (OZEMPIC, 0.25 OR 0.5 MG/DOSE,) 2 MG/1.5ML SOPN; Inject 0.25 mg into the skin every 7 (seven) days.  Dispense: 1.5 mL; Refill: 0  3. Class 2 severe obesity with serious comorbidity and body mass index (BMI) of 38.0 to 38.9 in adult, unspecified obesity type (HCC) Harold Trujillo is currently in the action stage of change. As such, his goal is to continue with weight loss efforts. He has agreed to the Category 4 Plan.   Behavioral modification strategies: increasing lean protein intake and meal planning and cooking strategies.  Harold Trujillo has agreed to follow-up with our clinic in 4 weeks with Dr. Raliegh Scarlet. He was informed of the importance of frequent follow-up visits to maximize his success with intensive lifestyle modifications for his multiple health conditions.  Objective:   VITALS: Per patient if applicable, see vitals. GENERAL: Alert and in no acute distress. CARDIOPULMONARY: No increased WOB. Speaking in clear sentences.  PSYCH: Pleasant and cooperative. Speech normal rate and rhythm. Affect is appropriate. Insight and judgement are appropriate. Attention is focused, linear, and appropriate.  NEURO: Oriented as arrived to appointment on time with no prompting.   Lab Results  Component Value Date   CREATININE 1.12 01/01/2021   BUN 13 01/01/2021   NA 140 01/01/2021   K 4.5 01/01/2021   CL 101 01/01/2021   CO2 24 01/01/2021   Lab Results  Component Value Date   ALT 26 01/01/2021   AST 20 01/01/2021   ALKPHOS 60 01/01/2021   BILITOT 0.5 01/01/2021   Lab  Results  Component Value Date   HGBA1C 5.8 (H) 08/03/2020   Lab Results  Component Value Date   INSULIN 48.8 (H) 08/03/2020   Lab Results  Component Value Date   TSH 2.420 08/03/2020   Lab Results  Component Value Date   CHOL 188 01/01/2021   HDL 41 01/01/2021   LDLCALC 115 (H) 01/01/2021   TRIG 180 (H) 01/01/2021   CHOLHDL 4.6 01/01/2021    Lab Results  Component Value Date   WBC 7.6 08/03/2020   HGB 15.6 08/03/2020   HCT 50.4 08/03/2020   MCV 76 (L) 08/03/2020   PLT 304 08/03/2020   No results found for: IRON, TIBC, FERRITIN  Attestation Statements:   Reviewed by clinician on day of visit: allergies, medications, problem list, medical history, surgical history, family history, social history, and previous encounter notes.   I, Soua Caltagirone, am acting as transcriptionist for Dennard Nip, MD.  I have reviewed the above documentation for accuracy and completeness, and I agree with the above. - Dennard Nip, MD

## 2021-02-06 ENCOUNTER — Encounter (INDEPENDENT_AMBULATORY_CARE_PROVIDER_SITE_OTHER): Payer: Self-pay | Admitting: Family Medicine

## 2021-02-06 NOTE — Telephone Encounter (Addendum)
Per the last note:  Plan: Check CMP and fastin lipid panel today. Pt is fasting.  Check A1c and fasting insulin today.  It should have been checked, looks as if lab did not see the order.

## 2021-02-06 NOTE — Telephone Encounter (Signed)
Last office visit with Dr. Leafy Ro.  Harold Pick, do you know why a few of this pt's labs may not have been drawn?

## 2021-02-13 ENCOUNTER — Encounter (INDEPENDENT_AMBULATORY_CARE_PROVIDER_SITE_OTHER): Payer: Self-pay | Admitting: Family Medicine

## 2021-02-19 ENCOUNTER — Ambulatory Visit (INDEPENDENT_AMBULATORY_CARE_PROVIDER_SITE_OTHER): Payer: Self-pay | Admitting: Urology

## 2021-02-19 ENCOUNTER — Other Ambulatory Visit: Payer: Self-pay

## 2021-02-19 VITALS — BP 150/89 | HR 82 | Temp 98.1°F

## 2021-02-19 DIAGNOSIS — Z125 Encounter for screening for malignant neoplasm of prostate: Secondary | ICD-10-CM

## 2021-02-19 DIAGNOSIS — N5201 Erectile dysfunction due to arterial insufficiency: Secondary | ICD-10-CM

## 2021-02-19 NOTE — Patient Instructions (Signed)
Prostaglandin intracavernosal injection What is this medicine? Prostaglandin is used to treat erectile dysfunction (ED). This medicine helps to create and maintain an erection. This medicine may be used for other purposes; ask your health care provider or pharmacist if you have questions. COMMON BRAND NAME(S): Caverject, Caverject Impulse, Edex What should I tell my health care provider before I take this medicine? They need to know if you have any of these conditions:  an abnormally formed penis  have been advised not to engage in sexual activity  have ever had a painful erection that lasted more than 4 hours  heart problems  leukemia  low blood pressure  penile implant  sickle cell disease or trait  tumor of the bone marrow (multiple myeloma)  an unusual or allergic reaction to alprostadil or other medicines, foods, dyes, or preservatives How should I use this medicine? This medicine is for injection into the penis. You will be taught how to use this medicine. Use exactly as directed. Do not take your medicine more often than directed. It is important that you put your used needles and syringes in a special sharps container. Do not put them in a trash can. If you do not have a sharps container, call your pharmacist or healthcare provider to get one. This medicine is for use in men only and is not for use in children. Overdosage: If you think you have taken too much of this medicine contact a poison control center or emergency room at once. NOTE: This medicine is only for you. Do not share this medicine with others. What if I miss a dose? You should not use this medicine more than 3 times a week. Each dose should be given at least 24 hours apart. What may interact with this medicine?  medicines for blood pressure This list may not describe all possible interactions. Give your health care provider a list of all the medicines, herbs, non-prescription drugs, or dietary supplements  you use. Also tell them if you smoke, drink alcohol, or use illegal drugs. Some items may interact with your medicine. What should I watch for while using this medicine? Contact your doctor or health care professional right away if you have an erection that lasts longer than 4 hours or if it becomes painful. This may be a sign of a serious problem and must be treated right away to prevent permanent damage. Do not change the dose of your medication. Call your doctor or health care professional to determine if your dose needs to be changed. This medicine does not protect you or your partner against HIV infection (the virus that causes AIDS) or other sexually transmitted diseases. What side effects may I notice from receiving this medicine? Side effects that you should report to your doctor or health care professional as soon as possible:  allergic reactions like skin rash, itching or hives, swelling of the face, lips, or tongue  prolonged or painful erection Side effects that usually do not require medical attention (report to your doctor or health care professional if they continue or are bothersome):  bleeding, bruising, or pain at site of injection  change in blood pressure This list may not describe all possible side effects. Call your doctor for medical advice about side effects. You may report side effects to FDA at 1-800-FDA-1088. Where should I keep my medicine? Keep out of reach of children. When traveling, do not store this medicine in checked luggage during air travel or leave in a closed automobile. NOTE:  This sheet is a summary. It may not cover all possible information. If you have questions about this medicine, talk to your doctor, pharmacist, or health care provider.  2021 Elsevier/Gold Standard

## 2021-02-19 NOTE — Progress Notes (Signed)
Urological Symptom Review  Patient is experiencing the following symptoms: none   Review of Systems  Gastrointestinal (upper)  : Negative for upper GI symptoms  Gastrointestinal (lower) : Negative for lower GI symptoms  Constitutional : Negative for symptoms  Skin: Negative for skin symptoms  Eyes: Negative for eye symptoms  Ear/Nose/Throat : Negative for Ear/Nose/Throat symptoms  Hematologic/Lymphatic: Negative for Hematologic/Lymphatic symptoms  Cardiovascular : Negative for cardiovascular symptoms  Respiratory : Negative for respiratory symptoms  Endocrine: Negative for endocrine symptoms  Musculoskeletal: Back pain  Neurological: Negative for neurological symptoms  Psychologic: Negative for psychiatric symptoms

## 2021-02-19 NOTE — Progress Notes (Signed)
Here for injection teaching and PSA management.   #1 PCa screening -patient with a PSA of 0.7 in 2017.  He was seen at "[facility]" and had a PSA 07/27/2020 which was 2.7 and then 10/30/2020 of 3.6. Repeat PSA 02/22 down to 1.6.  No FH PCa. No h/o BPH. No 5ari use.   #2 erectile dysfunction-patient taking sildenafil 20 mg tablets. Working well. He did PRP injection and acoustic wave therapy x 2.   H/o low testosterone-patient reports history of low testosterone and is on testosterone cypionate injections. T levels around 800. 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   He works in Press photographer for [employer].   Again, he is doing pretty well with pde5i. We discussed again the nature r/b/a to injections, risk of priapism among others and how to deal with it. He does want to proceed with injections.    Procedure:  The right corpora was prepped and 0.25 ml of a 20 mcg/ml prostaglandin solution was injected. After about 15 minutes he had a good erection. He will continue with 0.25 ml of the 20 mcg. In the future, we could prescribe a 5 to 10 ml vial and cut the concentration to 10 mcg/ml with a 0.5 ml injection.     A/P:  1) PCa screening - discussed PSA level. Back down to normal level. Continue to check yearly.   2) ED - discussed dosing - keep at 0.2 - 0.25. Again discussed priapism and to call or go to ED.

## 2021-02-20 ENCOUNTER — Telehealth (INDEPENDENT_AMBULATORY_CARE_PROVIDER_SITE_OTHER): Payer: Self-pay

## 2021-02-20 ENCOUNTER — Other Ambulatory Visit (INDEPENDENT_AMBULATORY_CARE_PROVIDER_SITE_OTHER): Payer: Self-pay

## 2021-02-20 DIAGNOSIS — R7303 Prediabetes: Secondary | ICD-10-CM

## 2021-02-20 MED ORDER — OZEMPIC (0.25 OR 0.5 MG/DOSE) 2 MG/1.5ML ~~LOC~~ SOPN: 0.5000 mg | PEN_INJECTOR | SUBCUTANEOUS | 0 refills | Status: DC

## 2021-02-20 NOTE — Telephone Encounter (Signed)
Please increase to .5

## 2021-02-20 NOTE — Telephone Encounter (Signed)
PA has been initiated via [email or web address] for Ozempic 2mg /1.62mL. Pt is now taking 0.25mg  SQ QW.   [facility] Fordyce Rx #: [id number] Ozempic (0.25 or 0.5 MG/DOSE) 2MG /1.5ML pen-injectors   Form: [facility] Electronic Prior Authorization Form (2017 NCPDP) Determination: Wait for Determination Please wait for [facility] 2017 NCPDP to return a determination.

## 2021-02-26 ENCOUNTER — Ambulatory Visit (INDEPENDENT_AMBULATORY_CARE_PROVIDER_SITE_OTHER): Payer: Commercial Managed Care - PPO | Admitting: Family Medicine

## 2021-03-02 ENCOUNTER — Other Ambulatory Visit (INDEPENDENT_AMBULATORY_CARE_PROVIDER_SITE_OTHER): Payer: Self-pay | Admitting: Family Medicine

## 2021-03-02 DIAGNOSIS — E559 Vitamin D deficiency, unspecified: Secondary | ICD-10-CM

## 2021-03-13 ENCOUNTER — Other Ambulatory Visit: Payer: Self-pay

## 2021-03-13 ENCOUNTER — Encounter (INDEPENDENT_AMBULATORY_CARE_PROVIDER_SITE_OTHER): Payer: Self-pay | Admitting: Family Medicine

## 2021-03-13 ENCOUNTER — Ambulatory Visit (INDEPENDENT_AMBULATORY_CARE_PROVIDER_SITE_OTHER): Payer: Commercial Managed Care - PPO | Admitting: Family Medicine

## 2021-03-13 VITALS — BP 127/84 | HR 72 | Temp 97.5°F | Ht 72.0 in | Wt 282.0 lb

## 2021-03-13 DIAGNOSIS — Z9189 Other specified personal risk factors, not elsewhere classified: Secondary | ICD-10-CM

## 2021-03-13 DIAGNOSIS — F3289 Other specified depressive episodes: Secondary | ICD-10-CM | POA: Diagnosis not present

## 2021-03-13 DIAGNOSIS — E7849 Other hyperlipidemia: Secondary | ICD-10-CM

## 2021-03-13 DIAGNOSIS — R7303 Prediabetes: Secondary | ICD-10-CM | POA: Diagnosis not present

## 2021-03-13 DIAGNOSIS — Z6841 Body Mass Index (BMI) 40.0 and over, adult: Secondary | ICD-10-CM

## 2021-03-13 DIAGNOSIS — E559 Vitamin D deficiency, unspecified: Secondary | ICD-10-CM

## 2021-03-13 MED ORDER — VITAMIN D (ERGOCALCIFEROL) 1.25 MG (50000 UNIT) PO CAPS: 50000.0000 [IU] | ORAL_CAPSULE | ORAL | 0 refills | Status: DC

## 2021-03-13 MED ORDER — OZEMPIC (1 MG/DOSE) 2 MG/1.5ML ~~LOC~~ SOPN: 1.0000 mg | PEN_INJECTOR | SUBCUTANEOUS | 0 refills | Status: DC

## 2021-03-13 NOTE — Progress Notes (Signed)
The 10-year ASCVD risk score Mikey Bussing DC Brooke Bonito., et al., 2013) is: 7.9%   Values used to calculate the score:     Age: 60 years     Sex: Male     Is Non-Hispanic African American: No     Diabetic: No     Tobacco smoker: No     Systolic Blood Pressure: 798 mmHg     Is BP treated: No     HDL Cholesterol: 41 mg/dL     Total Cholesterol: 188 mg/dL

## 2021-03-15 ENCOUNTER — Encounter (INDEPENDENT_AMBULATORY_CARE_PROVIDER_SITE_OTHER): Payer: Self-pay | Admitting: Family Medicine

## 2021-03-15 DIAGNOSIS — F32A Depression, unspecified: Secondary | ICD-10-CM | POA: Insufficient documentation

## 2021-03-15 NOTE — Progress Notes (Signed)
Chief Complaint:   OBESITY Harold Trujillo is here to discuss his progress with his obesity treatment plan along with follow-up of his obesity related diagnoses. Harold Trujillo is on the Category 4 Plan and states he is following his eating plan approximately 10% of the time. Harold Trujillo states he is riding his bike for 30 minutes 1 time per week.  Today's visit was #: 9 Starting weight: 306 lbs Starting date: 08/03/2020 Today's weight: 282 lbs Today's date: 03/13/2021 Total lbs lost to date: 24 lbs Total lbs lost since last in-office visit: 0  Interim History: Harold Trujillo has been gaining weight since December 2021.  His low weight was 277 pounds in December 2021.  He is now 282 pounds.  He notes that next Monday he plans on getting back on plan.    Subjective:   1. Pre-diabetes He notes he does not overeat as he used to with Ozempic.  Denies nausea, constipation.   Lab Results  Component Value Date   HGBA1C 5.8 (H) 08/03/2020   Lab Results  Component Value Date   INSULIN 48.8 (H) 08/03/2020   2. Other hyperlipidemia ASCVD risk score 7.9%.  No statin.  LDL was 115 and TG was 180.  Lab Results  Component Value Date   ALT 26 01/01/2021   AST 20 01/01/2021   ALKPHOS 60 01/01/2021   BILITOT 0.5 01/01/2021   Lab Results  Component Value Date   CHOL 188 01/01/2021   HDL 41 01/01/2021   LDLCALC 115 (H) 01/01/2021   TRIG 180 (H) 01/01/2021   CHOLHDL 4.6 01/01/2021   3. Other depression, with emotional eating He notes he has been on bupropion in the past and it helped with cravings.  He notes fixation on food at night.  4. At risk for heart disease Harold Trujillo is at a higher than average risk for cardiovascular disease due to obesity and ASCVD risk score of 7.9%.   Assessment/Plan:   1. Pre-diabetes Increase dose of Ozempic to 1 mg subcutaneously weekly.  Check A1c at next office visit.  - Increase Semaglutide, 1 MG/DOSE, (OZEMPIC, 1 MG/DOSE,) 2 MG/1.5ML SOPN; Inject 1 mg into the skin  once a week.  Dispense: 1.5 mL; Refill: 0  2. Other hyperlipidemia Will check FLP at next office visit.  - Refill Vitamin D, Ergocalciferol, (DRISDOL) 1.25 MG (50000 UNIT) CAPS capsule; Take 1 capsule (50,000 Units total) by mouth every 7 (seven) days.  Dispense: 4 capsule; Refill: 0  3. Other depression, with emotional eating Consider starting bupropion at next office visit.  4. At risk for heart disease Harold Trujillo was given approximately 15 minutes of coronary artery disease prevention counseling today. He is 60 y.o. male and has risk factors for heart disease including obesity. We discussed intensive lifestyle modifications today with an emphasis on specific weight loss instructions and strategies.   Repetitive spaced learning was employed today to elicit superior memory formation and behavioral change.  5. Obesity: Current BMI 13  Harold Trujillo is currently in the action stage of change. As such, his goal is to continue with weight loss efforts. He has agreed to the Category 4 Plan.   We discussed weight loss surgery, and he is interested.  Exercise goals: As is.  Behavioral modification strategies: increasing lean protein intake and decreasing simple carbohydrates.  Harold Trujillo has agreed to follow-up with our clinic in 3 weeks. He was informed of the importance of frequent follow-up visits to maximize his success with intensive lifestyle modifications for his multiple health conditions.  Objective:   Blood pressure 127/84, pulse 72, temperature (!) 97.5 F (36.4 C), height 6' (1.829 m), weight 282 lb (127.9 kg), SpO2 97 %. Body mass index is 38.25 kg/m.  General: Cooperative, alert, well developed, in no acute distress. HEENT: Conjunctivae and lids unremarkable. Cardiovascular: Regular rhythm.  Lungs: Normal work of breathing. Neurologic: No focal deficits.   Lab Results  Component Value Date   CREATININE 1.12 01/01/2021   BUN 13 01/01/2021   NA 140 01/01/2021   K 4.5  01/01/2021   CL 101 01/01/2021   CO2 24 01/01/2021   Lab Results  Component Value Date   ALT 26 01/01/2021   AST 20 01/01/2021   ALKPHOS 60 01/01/2021   BILITOT 0.5 01/01/2021   Lab Results  Component Value Date   HGBA1C 5.8 (H) 08/03/2020   Lab Results  Component Value Date   INSULIN 48.8 (H) 08/03/2020   Lab Results  Component Value Date   TSH 2.420 08/03/2020   Lab Results  Component Value Date   CHOL 188 01/01/2021   HDL 41 01/01/2021   LDLCALC 115 (H) 01/01/2021   TRIG 180 (H) 01/01/2021   CHOLHDL 4.6 01/01/2021   Lab Results  Component Value Date   WBC 7.6 08/03/2020   HGB 15.6 08/03/2020   HCT 50.4 08/03/2020   MCV 76 (L) 08/03/2020   PLT 304 08/03/2020   Attestation Statements:   Reviewed by clinician on day of visit: allergies, medications, problem list, medical history, surgical history, family history, social history, and previous encounter notes.  I, Water quality scientist, CMA, am acting as Location manager for [employer], [place].  I have reviewed the above documentation for accuracy and completeness, and I agree with the above. -  Georgianne Fick, FNP

## 2021-04-09 ENCOUNTER — Ambulatory Visit (INDEPENDENT_AMBULATORY_CARE_PROVIDER_SITE_OTHER): Payer: Commercial Managed Care - PPO | Admitting: Family Medicine

## 2021-04-10 ENCOUNTER — Encounter (INDEPENDENT_AMBULATORY_CARE_PROVIDER_SITE_OTHER): Payer: Self-pay | Admitting: Family Medicine

## 2021-04-10 ENCOUNTER — Other Ambulatory Visit: Payer: Self-pay

## 2021-04-10 ENCOUNTER — Telehealth (INDEPENDENT_AMBULATORY_CARE_PROVIDER_SITE_OTHER): Payer: Commercial Managed Care - PPO | Admitting: Family Medicine

## 2021-04-10 DIAGNOSIS — R7303 Prediabetes: Secondary | ICD-10-CM

## 2021-04-10 DIAGNOSIS — Z6841 Body Mass Index (BMI) 40.0 and over, adult: Secondary | ICD-10-CM | POA: Diagnosis not present

## 2021-04-10 DIAGNOSIS — E66813 Obesity, class 3: Secondary | ICD-10-CM

## 2021-04-10 DIAGNOSIS — F3289 Other specified depressive episodes: Secondary | ICD-10-CM | POA: Diagnosis not present

## 2021-04-10 MED ORDER — BUPROPION HCL ER (SR) 150 MG PO TB12: 150.0000 mg | ORAL_TABLET | Freq: Every morning | ORAL | 0 refills | Status: DC

## 2021-04-10 MED ORDER — OZEMPIC (1 MG/DOSE) 2 MG/1.5ML ~~LOC~~ SOPN
1.0000 mg | PEN_INJECTOR | SUBCUTANEOUS | 0 refills | Status: DC
Start: 2021-04-10 — End: 2021-05-15

## 2021-04-11 NOTE — Progress Notes (Signed)
TeleHealth Visit:  Due to the COVID-19 pandemic, this visit was completed with telemedicine (audio/video) technology to reduce patient and provider exposure as well as to preserve personal protective equipment.   Harold Trujillo has verbally consented to this TeleHealth visit. The patient is located at home, the provider is located at the [facility] office. The participants in this visit include the listed provider and patient. The visit was conducted today via MyChart video.   Chief Complaint: OBESITY Harold Trujillo is here to discuss his progress with his obesity treatment plan along with follow-up of his obesity related diagnoses. Harold Trujillo is on the Category 4 Plan and states he is following his eating plan approximately 60% of the time. Harold Trujillo states he is walking for 30 minutes 3 times per week.  Today's visit was #: 10 Starting weight: 306 lbs Starting date: 08/03/2020  Interim History: Harold Trujillo weighs 280 lbs at home today. He thinks he has lost 3 lbs. He says he often skips breakfast. He packs lunch occasionally, but he knows he needs to pack lunch every day. When he does not pack lunch he "punishes" himself by waiting until dinner to eat.  Subjective:   1. Pre-diabetes We increased Harold Trujillo's Ozempic last office visit to 1 mg. He denies nausea or constipation. Hunger is controlled.   Lab Results  Component Value Date   HGBA1C 5.8 (H) 08/03/2020   Lab Results  Component Value Date   INSULIN 48.8 (H) 08/03/2020   2. Other depression, with emotional eating Harold Trujillo notes cravings throughout the evening.  Assessment/Plan:   1. Pre-diabetes . We will refill Ozempic for 1 month.  - Semaglutide, 1 MG/DOSE, (OZEMPIC, 1 MG/DOSE,) 2 MG/1.5ML SOPN; Inject 1 mg into the skin once a week.  Dispense: 1.5 mL; Refill: 0  2. Other depression, with emotional eating  Harold Trujillo agreed to start bupropion 150 mg q AM with no refills.  - buPROPion (WELLBUTRIN SR) 150 MG 12 hr tablet; Take  1 tablet (150 mg total) by mouth in the morning.  Dispense: 30 tablet; Refill: 0  3. Obesity: Current BMI 25 [name] is currently in the action stage of change. As such, his goal is to continue with weight loss efforts. He has agreed to the Category 4 Plan.   We discussed weight loss surgery in depth. We discussed he will need to weigh at least 269 lbs to be eligible for surgery. He has attended the bariatric surgery seminar and has the packet at home but has not competed it. Harold Trujillo was advised that bariatric surgery is a tool for weight loss and will not be successful long-term without significant dietary changes and exercise.   Exercise goals: As is.  Behavioral modification strategies: decreasing simple carbohydrates and no skipping meals.  Harold Trujillo has agreed to follow-up with our clinic in 3 weeks.   Objective:   VITALS: Per patient if applicable, see vitals. GENERAL: Alert and in no acute distress. CARDIOPULMONARY: No increased WOB. Speaking in clear sentences.  PSYCH: Pleasant and cooperative. Speech normal rate and rhythm. Affect is appropriate. Insight and judgement are appropriate. Attention is focused, linear, and appropriate.  NEURO: Oriented as arrived to appointment on time with no prompting.   Lab Results  Component Value Date   CREATININE 1.12 01/01/2021   BUN 13 01/01/2021   NA 140 01/01/2021   K 4.5 01/01/2021   CL 101 01/01/2021   CO2 24 01/01/2021   Lab Results  Component Value Date   ALT 26 01/01/2021  AST 20 01/01/2021   ALKPHOS 60 01/01/2021   BILITOT 0.5 01/01/2021   Lab Results  Component Value Date   HGBA1C 5.8 (H) 08/03/2020   Lab Results  Component Value Date   INSULIN 48.8 (H) 08/03/2020   Lab Results  Component Value Date   TSH 2.420 08/03/2020   Lab Results  Component Value Date   CHOL 188 01/01/2021   HDL 41 01/01/2021   LDLCALC 115 (H) 01/01/2021   TRIG 180 (H) 01/01/2021   CHOLHDL 4.6 01/01/2021   Lab Results  Component Value  Date   WBC 7.6 08/03/2020   HGB 15.6 08/03/2020   HCT 50.4 08/03/2020   MCV 76 (L) 08/03/2020   PLT 304 08/03/2020   No results found for: IRON, TIBC, FERRITIN  Attestation Statements:   Reviewed by clinician on day of visit: allergies, medications, problem list, medical history, surgical history, family history, social history, and previous encounter notes.   Wilhemena Durie, am acting as Location manager for [employer], FNP-C.  I have reviewed the above documentation for accuracy and completeness, and I agree with the above. - Georgianne Fick, FNP

## 2021-04-12 ENCOUNTER — Encounter (INDEPENDENT_AMBULATORY_CARE_PROVIDER_SITE_OTHER): Payer: Self-pay | Admitting: Family Medicine

## 2021-04-17 ENCOUNTER — Ambulatory Visit (INDEPENDENT_AMBULATORY_CARE_PROVIDER_SITE_OTHER): Payer: Commercial Managed Care - PPO | Admitting: Family Medicine

## 2021-05-14 ENCOUNTER — Other Ambulatory Visit (INDEPENDENT_AMBULATORY_CARE_PROVIDER_SITE_OTHER): Payer: Self-pay | Admitting: Family Medicine

## 2021-05-14 DIAGNOSIS — R7303 Prediabetes: Secondary | ICD-10-CM

## 2021-05-14 NOTE — Telephone Encounter (Signed)
Last seen Dawn 

## 2021-05-14 NOTE — Telephone Encounter (Signed)
Mychart message sent.

## 2021-05-15 ENCOUNTER — Other Ambulatory Visit (INDEPENDENT_AMBULATORY_CARE_PROVIDER_SITE_OTHER): Payer: Self-pay | Admitting: Adult Health

## 2021-05-15 DIAGNOSIS — R7303 Prediabetes: Secondary | ICD-10-CM

## 2021-05-15 MED ORDER — OZEMPIC (1 MG/DOSE) 2 MG/1.5ML ~~LOC~~ SOPN
1.0000 mg | PEN_INJECTOR | SUBCUTANEOUS | 0 refills | Status: DC
Start: 2021-05-15 — End: 2021-05-21

## 2021-05-17 ENCOUNTER — Encounter (INDEPENDENT_AMBULATORY_CARE_PROVIDER_SITE_OTHER): Payer: Self-pay | Admitting: Family Medicine

## 2021-05-21 ENCOUNTER — Encounter (INDEPENDENT_AMBULATORY_CARE_PROVIDER_SITE_OTHER): Payer: Self-pay | Admitting: Family Medicine

## 2021-05-21 ENCOUNTER — Other Ambulatory Visit: Payer: Self-pay

## 2021-05-21 ENCOUNTER — Ambulatory Visit (INDEPENDENT_AMBULATORY_CARE_PROVIDER_SITE_OTHER): Payer: Commercial Managed Care - PPO | Admitting: Family Medicine

## 2021-05-21 VITALS — BP 142/90 | HR 87 | Temp 97.7°F | Ht 72.0 in | Wt 278.0 lb

## 2021-05-21 DIAGNOSIS — F3289 Other specified depressive episodes: Secondary | ICD-10-CM

## 2021-05-21 DIAGNOSIS — E559 Vitamin D deficiency, unspecified: Secondary | ICD-10-CM | POA: Diagnosis not present

## 2021-05-21 DIAGNOSIS — R7303 Prediabetes: Secondary | ICD-10-CM | POA: Diagnosis not present

## 2021-05-21 DIAGNOSIS — Z6841 Body Mass Index (BMI) 40.0 and over, adult: Secondary | ICD-10-CM

## 2021-05-21 MED ORDER — BUPROPION HCL ER (SR) 150 MG PO TB12: 150.0000 mg | ORAL_TABLET | Freq: Every morning | ORAL | 0 refills | Status: DC

## 2021-05-21 MED ORDER — VITAMIN D (ERGOCALCIFEROL) 1.25 MG (50000 UNIT) PO CAPS
50000.0000 [IU] | ORAL_CAPSULE | ORAL | 0 refills | Status: DC
Start: 2021-05-21 — End: 2023-08-19

## 2021-05-21 MED ORDER — OZEMPIC (1 MG/DOSE) 2 MG/1.5ML ~~LOC~~ SOPN: 1.0000 mg | PEN_INJECTOR | SUBCUTANEOUS | 0 refills | Status: DC

## 2021-05-22 ENCOUNTER — Encounter (INDEPENDENT_AMBULATORY_CARE_PROVIDER_SITE_OTHER): Payer: Self-pay | Admitting: Family Medicine

## 2021-05-22 NOTE — Progress Notes (Signed)
Chief Complaint:   OBESITY Harold Trujillo is here to discuss his progress with his obesity treatment plan along with follow-up of his obesity related diagnoses. Harold Trujillo is on the Category 4 Plan and states he is following his eating plan approximately 40% of the time. Harold Trujillo states he is not currently exercising.  Today's visit was #: 11 Starting weight: 306 lbs Starting date: 08/03/2020 Today's weight: 278 lbs Today's date: 05/21/2021 Total lbs lost to date: 28 Total lbs lost since last in-office visit: 4  Interim History: Harold Trujillo is down 4 lbs today. He is still interested in bariatric surgery but his insurance won't cover it. He may consider getting on his wife's insurance for coverage.  He feels the Ozempic has made a huge difference in his appetite. He admits to indulging in some sweets (chocolate covered raisins).   Subjective:   1. Vitamin D deficiency Harold Trujillo's Vit D is low at 19.5. He is on weekly prescription Vit D.  2. Other depression, with emotional eating Harold Trujillo feels the bupropion has helped significantly with cravings. BP is slightly elevated today. He is also on Paxil 5 mg daily.  BP Readings from Last 3 Encounters:  05/21/21 (!) 142/90  03/13/21 127/84  02/19/21 (!) 150/89   3. Pre-diabetes Harold Trujillo's last A1c was 5.8. He is on Ozempic 1 mg daily. Appetite well-controlled.  Lab Results  Component Value Date   HGBA1C 5.8 (H) 08/03/2020   Lab Results  Component Value Date   INSULIN 48.8 (H) 08/03/2020    Assessment/Plan:   1. Vitamin D deficiency He agrees to continue to take prescription Vitamin D @50 ,000 IU every week and will follow-up for routine testing of Vitamin D, at least 2-3 times per year to avoid over-replacement.  - Vitamin D, Ergocalciferol, (DRISDOL) 1.25 MG (50000 UNIT) CAPS capsule; Take 1 capsule (50,000 Units total) by mouth every 7 (seven) days.  Dispense: 4 capsule; Refill: 0  2. Other depression, with emotional eating Refill:  -  buPROPion (WELLBUTRIN SR) 150 MG 12 hr tablet; Take 1 tablet (150 mg total) by mouth in the morning.  Dispense: 30 tablet; Refill: 0  3. Pre-diabetes Refill:  - Semaglutide, 1 MG/DOSE, (OZEMPIC, 1 MG/DOSE,) 2 MG/1.5ML SOPN; Inject 1 mg into the skin once a week.  Dispense: 1.5 mL; Refill: 0  4. Obesity: Current BMI 37.7  Harold Trujillo is currently in the action stage of change. As such, his goal is to continue with weight loss efforts. He has agreed to the Category 4 Plan.   Exercise goals:  Harold Trujillo will start exercise. He plans on starting resistance training.  Behavioral modification strategies: decreasing simple carbohydrates.  Harold Trujillo has agreed to follow-up with our clinic in 4 weeks.  Objective:   Blood pressure (!) 142/90, pulse 87, temperature 97.7 F (36.5 C), height 6' (1.829 m), weight 278 lb (126.1 kg), SpO2 97 %. Body mass index is 37.7 kg/m.  General: Cooperative, alert, well developed, in no acute distress. HEENT: Conjunctivae and lids unremarkable. Cardiovascular: Regular rhythm.  Lungs: Normal work of breathing. Neurologic: No focal deficits.   Lab Results  Component Value Date   CREATININE 1.12 01/01/2021   BUN 13 01/01/2021   NA 140 01/01/2021   K 4.5 01/01/2021   CL 101 01/01/2021   CO2 24 01/01/2021   Lab Results  Component Value Date   ALT 26 01/01/2021   AST 20 01/01/2021   ALKPHOS 60 01/01/2021   BILITOT 0.5 01/01/2021   Lab Results  Component Value Date  HGBA1C 5.8 (H) 08/03/2020   Lab Results  Component Value Date   INSULIN 48.8 (H) 08/03/2020   Lab Results  Component Value Date   TSH 2.420 08/03/2020   Lab Results  Component Value Date   CHOL 188 01/01/2021   HDL 41 01/01/2021   LDLCALC 115 (H) 01/01/2021   TRIG 180 (H) 01/01/2021   CHOLHDL 4.6 01/01/2021   Lab Results  Component Value Date   WBC 7.6 08/03/2020   HGB 15.6 08/03/2020   HCT 50.4 08/03/2020   MCV 76 (L) 08/03/2020   PLT 304 08/03/2020   No results found for:  IRON, TIBC, FERRITIN  Attestation Statements:   Reviewed by clinician on day of visit: allergies, medications, problem list, medical history, surgical history, family history, social history, and previous encounter notes.  Coral Ceo, CMA, am acting as Location manager for [employer], [place].  I have reviewed the above documentation for accuracy and completeness, and I agree with the above. -  Georgianne Fick, FNP

## 2021-06-07 ENCOUNTER — Ambulatory Visit: Payer: Commercial Managed Care - PPO | Admitting: Family Medicine

## 2021-06-14 ENCOUNTER — Other Ambulatory Visit (INDEPENDENT_AMBULATORY_CARE_PROVIDER_SITE_OTHER): Payer: Self-pay | Admitting: Family Medicine

## 2021-06-14 DIAGNOSIS — R7303 Prediabetes: Secondary | ICD-10-CM

## 2021-06-14 NOTE — Telephone Encounter (Signed)
Last OV with Dawn 

## 2021-06-19 ENCOUNTER — Ambulatory Visit (INDEPENDENT_AMBULATORY_CARE_PROVIDER_SITE_OTHER): Payer: Commercial Managed Care - PPO | Admitting: Family Medicine

## 2021-06-27 ENCOUNTER — Ambulatory Visit (INDEPENDENT_AMBULATORY_CARE_PROVIDER_SITE_OTHER): Payer: Commercial Managed Care - PPO | Admitting: Family Medicine

## 2021-07-02 ENCOUNTER — Encounter: Payer: Self-pay | Admitting: Urology

## 2021-07-02 ENCOUNTER — Other Ambulatory Visit: Payer: Self-pay

## 2021-07-02 ENCOUNTER — Ambulatory Visit (INDEPENDENT_AMBULATORY_CARE_PROVIDER_SITE_OTHER): Payer: Self-pay | Admitting: Urology

## 2021-07-02 VITALS — BP 142/88 | HR 93

## 2021-07-02 DIAGNOSIS — N401 Enlarged prostate with lower urinary tract symptoms: Secondary | ICD-10-CM

## 2021-07-02 DIAGNOSIS — N138 Other obstructive and reflux uropathy: Secondary | ICD-10-CM

## 2021-07-02 DIAGNOSIS — N5201 Erectile dysfunction due to arterial insufficiency: Secondary | ICD-10-CM

## 2021-07-02 DIAGNOSIS — R3912 Poor urinary stream: Secondary | ICD-10-CM

## 2021-07-02 LAB — URINALYSIS, ROUTINE W REFLEX MICROSCOPIC
Bilirubin, UA: NEGATIVE
Glucose, UA: NEGATIVE
Ketones, UA: NEGATIVE
Leukocytes,UA: NEGATIVE
Nitrite, UA: NEGATIVE
Protein,UA: NEGATIVE
RBC, UA: NEGATIVE
Specific Gravity, UA: 1.015 (ref 1.005–1.030)
Urobilinogen, Ur: 0.2 mg/dL (ref 0.2–1.0)
pH, UA: 8.5 — ABNORMAL HIGH (ref 5.0–7.5)

## 2021-07-02 NOTE — Progress Notes (Signed)
Urological Symptom Review  Patient is experiencing the following symptoms: Stream starts and stops Weak stream   Review of Systems  Gastrointestinal (upper)  : Negative for upper GI symptoms  Gastrointestinal (lower) : Negative for lower GI symptoms  Constitutional : Negative for symptoms  Skin: Negative for skin symptoms  Eyes: Negative for eye symptoms  Ear/Nose/Throat : Negative for Ear/Nose/Throat symptoms  Hematologic/Lymphatic: Negative for Hematologic/Lymphatic symptoms  Cardiovascular : Negative for cardiovascular symptoms  Respiratory : Negative for respiratory symptoms  Endocrine: Negative for endocrine symptoms  Musculoskeletal: Back pain Joint pain  Neurological: Negative for neurological symptoms  Psychologic: Negative for psychiatric symptoms

## 2021-07-02 NOTE — Progress Notes (Signed)
07/02/2021 10:48 AM   Harold Trujillo Aug 17, 1961 [id number]  Referring provider: Erven Colla, DO [address] [place],  [place] [address]  No chief complaint on file.   HPI:   1) erectile dysfunction-patient taking sildenafil 20 mg tablets. Working well, but interested in injections. He did PRP injection and acoustic wave therapy x 2.   He started injections 03/22 with PGE 20 mcg/ml at 0.25 ml. Worked well but lost effectiveness. He went back to [facility] and was given Rx for trimix but hasnt started it. Also, was rx'd daily tadalafil which works well.      H/o low testosterone-patient reports history of low testosterone and is on testosterone cypionate injections. T levels around 800. 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  2) BPH, PCa screening -patient with a PSA of 0.7 in 2017.  He was seen at "[facility]" and had a PSA 07/27/2020 which was 2.7 and then 10/30/2020 of 3.6. Repeat PSA 02/22 down to 1.6. No FH PCa. No h/o BPH. No 5ari use. He has had a slow stream.    He works in Press photographer for [employer].   Today, seen for the above.     PMH: Past Medical History:  Diagnosis Date   ADHD (attention deficit hyperactivity disorder)    Alcohol abuse    Anxiety    Anxiety    Back pain    Depression    Depression    Gallbladder problem    High cholesterol    Hypertension    Insomnia    Joint pain    Pre-diabetes    Sleep apnea    no cpap use at this time   Sleep apnea    SOB (shortness of breath)    Vitamin D deficiency     Surgical History: Past Surgical History:  Procedure Laterality Date   CHOLECYSTECTOMY     for arm surgery  forearm surgery L    HERNIA REPAIR      Home Medications:  Allergies as of 07/02/2021       Reactions   Shellfish Allergy Nausea And Vomiting   Penicillins Other (See Comments)   Mom said I had reaction as a baby-per pt Has patient had a PCN reaction causing  immediate rash, facial/tongue/throat swelling, SOB or lightheadedness with hypotension: unknown Has patient had a PCN reaction causing severe rash involving mucus membranes or skin necrosis:unknown Has patient had a PCN reaction that required hospitalization? unknown Has patient had a PCN reaction occurring within the last 10 years: No If all of the above answers are "NO", then may proceed with Cephalosporin use.        Medication List        Accurate as of July 02, 2021 10:48 AM. If you have any questions, ask your nurse or doctor.          anastrozole 1 MG tablet Commonly known as: ARIMIDEX Take 0.5 mg by mouth 2 (two) times a week.   buPROPion 150 MG 12 hr tablet Commonly known as: Wellbutrin SR Take 1 tablet (150 mg total) by mouth in the morning.   Ozempic (1 MG/DOSE) 2 MG/1.5ML Sopn Generic drug: Semaglutide (1 MG/DOSE) Inject 1 mg into the skin once a week.   PARoxetine 10 MG tablet Commonly known as: Paxil Take one tablet po every morning   sildenafil 20 MG tablet Commonly known as: REVATIO SMARTSIG:2-4 Tablet(s) By Mouth PRN  tadalafil 5 MG tablet Commonly known as: CIALIS Take 5 mg by mouth daily.   testosterone cypionate 200 MG/ML injection Commonly known as: DEPOTESTOSTERONE CYPIONATE Inject 100 mg into the muscle 2 (two) times a week.   Vitamin D (Ergocalciferol) 1.25 MG (50000 UNIT) Caps capsule Commonly known as: DRISDOL Take 1 capsule (50,000 Units total) by mouth every 7 (seven) days.        Allergies:  Allergies  Allergen Reactions   Shellfish Allergy Nausea And Vomiting   Penicillins Other (See Comments)    Mom said I had reaction as a baby-per pt Has patient had a PCN reaction causing immediate rash, facial/tongue/throat swelling, SOB or lightheadedness with hypotension: unknown Has patient had a PCN reaction causing severe rash involving mucus membranes or skin necrosis:unknown Has patient had a PCN reaction that required  hospitalization? unknown Has patient had a PCN reaction occurring within the last 10 years: No If all of the above answers are "NO", then may proceed with Cephalosporin use.     Family History: Family History  Problem Relation Age of Onset   Diabetes Other    Diabetes Mother    Heart disease Mother    Sudden death Mother    Obesity Mother    Depression Father    Anxiety disorder Father    Colon cancer Neg Hx     Social History:  reports that he quit smoking about 13 years ago. His smoking use included cigarettes. He has a 30.00 pack-year smoking history. He has never used smokeless tobacco. He reports current alcohol use of about 12.0 standard drinks of alcohol per week. He reports that he does not use drugs.   Physical Exam: BP (!) 142/88   Pulse 93   Constitutional:  Alert and oriented, No acute distress. HEENT: [place] AT, moist mucus membranes.  Trachea midline, no masses. Cardiovascular: No clubbing, cyanosis, or edema. Respiratory: Normal respiratory effort, no increased work of breathing. Skin: No rashes, bruises or suspicious lesions. Neurologic: Grossly intact, no focal deficits, moving all 4 extremities. Psychiatric: Normal mood and affect.  Laboratory Data: Lab Results  Component Value Date   WBC 7.6 08/03/2020   HGB 15.6 08/03/2020   HCT 50.4 08/03/2020   MCV 76 (L) 08/03/2020   PLT 304 08/03/2020    Lab Results  Component Value Date   CREATININE 1.12 01/01/2021    Lab Results  Component Value Date   PSA 0.60 02/17/2014    Lab Results  Component Value Date   TESTOSTERONE 221 (L) 09/11/2017    Lab Results  Component Value Date   HGBA1C 5.8 (H) 08/03/2020    Urinalysis    Component Value Date/Time   APPEARANCEUR Clear 01/08/2021 1041   GLUCOSEU Negative 01/08/2021 1041   BILIRUBINUR Negative 01/08/2021 1041   PROTEINUR Negative 01/08/2021 1041   NITRITE Negative 01/08/2021 1041   LEUKOCYTESUR Negative 01/08/2021 1041    Lab Results   Component Value Date   LABMICR Comment 01/08/2021      Assessment & Plan:    1. ED - continue daily tadalafil and trimix PRN. He asked about trimix dosing and I deferred to the prescribing provider. Discussed importance of understanding the concentration and the dose. Also risk of priapism among others.  - Urinalysis, Routine w reflex microscopic  2. BPH - disc benefit of daily tadalafil for BPH and other meds such as alpha blocker or 5ari. He doesn't want any meds with sexual side effects. Consider cysto and procedures.   See back  in 6 mo for PSA and DRE.   No follow-ups on file.  Festus Aloe, MD  [facility]  [address] [place], [place] [address] [phone]

## 2021-07-31 MED ORDER — HYDROXYZINE PAMOATE 25 MG PO CAPS: 25.0000 mg | ORAL_CAPSULE | Freq: Three times a day (TID) | ORAL | 0 refills | Status: DC | PRN

## 2021-10-30 ENCOUNTER — Encounter: Payer: Self-pay | Admitting: Internal Medicine

## 2021-12-30 ENCOUNTER — Other Ambulatory Visit: Payer: Self-pay

## 2021-12-30 ENCOUNTER — Encounter (HOSPITAL_COMMUNITY): Payer: Self-pay

## 2021-12-30 ENCOUNTER — Emergency Department (HOSPITAL_COMMUNITY)
Admission: EM | Admit: 2021-12-30 | Discharge: 2021-12-30 | Disposition: A | Discharge status: Other Acute Inpatient Hospital | Payer: Commercial Managed Care - PPO | Attending: Emergency Medicine | Admitting: Emergency Medicine

## 2021-12-30 DIAGNOSIS — N4830 Priapism, unspecified: Secondary | ICD-10-CM | POA: Insufficient documentation

## 2021-12-30 MED ORDER — PHENYLEPHRINE 200 MCG/ML FOR PRIAPISM / HYPOTENSION
200.0000 ug | Freq: Once | INTRAMUSCULAR | Status: AC
  Administered 2021-12-30: 200 ug via INTRACAVERNOUS
  Filled 2021-12-30: qty 50

## 2021-12-30 MED ORDER — LIDOCAINE HCL (PF) 1 % IJ SOLN
5.0000 mL | Freq: Once | INTRAMUSCULAR | Status: AC
  Administered 2021-12-30: 5 mL via INTRADERMAL
  Filled 2021-12-30: qty 30

## 2021-12-30 NOTE — Discharge Instructions (Signed)
Go to the [facility], ER.  Dr. Alinda Money will see you there.

## 2021-12-30 NOTE — ED Triage Notes (Signed)
Pt arrived via POV c/o groin pain following Trimix Injection @ apprx 2200 last night. Pt reports injections for erections usually last apprx 60 minutes, but this evening the erection remained sustained and swelling has not gone down. Pt reports attempting cryo therapy to relieve erection but without success.

## 2021-12-30 NOTE — ED Provider Notes (Signed)
[facility] Provider Note   CSN: [id number] Arrival date & time: 12/30/21  0436     History  Chief Complaint  Patient presents with   Groin Pain    Harold Trujillo is a 61 y.o. male.  Patient is a 61 year old male with past medical history of hypertension, low testosterone, erectile dysfunction.  Patient presenting today with complaints of priapism.  Patient injected Trimix into his penis and now has an erection that will not go down.  It has been present for over 6 hours.  He has tried applying ice packs, however has had no relief.  It is now becoming painful.  The history is provided by the patient.      Home Medications Prior to Admission medications   Medication Sig Start Date End Date Taking? Authorizing Provider  anastrozole (ARIMIDEX) 1 MG tablet Take 0.5 mg by mouth 2 (two) times a week. 06/01/20   [provider]  buPROPion (WELLBUTRIN SR) 150 MG 12 hr tablet Take 1 tablet (150 mg total) by mouth in the morning. 05/21/21   Whitmire, Joneen Boers, FNP  hydrOXYzine (VISTARIL) 25 MG capsule Take 1 capsule (25 mg total) by mouth every 8 (eight) hours as needed for anxiety. 07/31/21   Elvia Collum M, DO  PARoxetine (PAXIL) 10 MG tablet Take one tablet po every morning 02/09/20   Mikey Kirschner, MD  Semaglutide, 1 MG/DOSE, (OZEMPIC, 1 MG/DOSE,) 2 MG/1.5ML SOPN Inject 1 mg into the skin once a week. 05/21/21   Whitmire, Joneen Boers, FNP  sildenafil (REVATIO) 20 MG tablet SMARTSIG:2-4 Tablet(s) By Mouth PRN 03/21/20   [provider]  tadalafil (CIALIS) 5 MG tablet Take 5 mg by mouth daily. 06/19/21   [provider]  testosterone cypionate (DEPOTESTOSTERONE CYPIONATE) 200 MG/ML injection Inject 100 mg into the muscle 2 (two) times a week. 08/01/20   [provider]  Vitamin D, Ergocalciferol, (DRISDOL) 1.25 MG (50000 UNIT) CAPS capsule Take 1 capsule (50,000 Units total) by mouth every 7 (seven) days. 05/21/21   Whitmire, Joneen Boers, FNP       Allergies    Shellfish allergy and Penicillins    Review of Systems   Review of Systems  All other systems reviewed and are negative.  Physical Exam Updated Vital Signs BP (!) 143/84 (BP Location: Left Arm)    Pulse 83    Temp 97.6 F (36.4 C) (Oral)    Resp 18    Ht 6' (1.829 m)    Wt 124.7 kg    SpO2 96%    BMI 37.30 kg/m  Physical Exam Vitals and nursing note reviewed.  Constitutional:      Appearance: Normal appearance.  HENT:     Head: Normocephalic and atraumatic.  Pulmonary:     Effort: Pulmonary effort is normal.  Genitourinary:    Comments: There is a persistent erection present Neurological:     Mental Status: He is alert.    ED Results / Procedures / Treatments   Labs (all labs ordered are listed, but only abnormal results are displayed) Labs Reviewed - No data to display  EKG None  Radiology No results found.  Procedures Irrigate corpus cavern, priapism  Date/Time: 12/30/2021 6:37 AM Performed by: Veryl Speak, MD Authorized by: Veryl Speak, MD  Consent: Verbal consent obtained. Risks and benefits: risks, benefits and alternatives were discussed Consent given by: patient Patient understanding: patient states understanding of the procedure being performed Patient consent: the patient's understanding of the procedure  matches consent given Procedure consent: procedure consent matches procedure scheduled Patient identity confirmed: verbally with patient and arm band Time out: Immediately prior to procedure a "time out" was called to verify the correct patient, procedure, equipment, support staff and site/side marked as required. Preparation: Patient was prepped and draped in the usual sterile fashion. Local anesthesia used: yes Anesthesia: local infiltration  Anesthesia: Local anesthesia used: yes Local Anesthetic: lidocaine 1% without epinephrine Anesthetic total: 3 mL  Sedation: Patient sedated: no  Patient tolerance: patient tolerated  the procedure well with no immediate complications Comments: Nearly 40 cc of dark blood aspirated, then injected with phenylephrine 200 mg, then a second 200 mg, with no relief of the priapism.      Medications Ordered in ED Medications  phenylephrine 200 mcg / ml CONC. DILUTION INJ (ED / Urology USE ONLY) (has no administration in time range)    ED Course/ Medical Decision Making/ A&P  Patient is a 61 year old male with history of erectile dysfunction presenting with priapism after using Trimix injection.  Aspiration and phenylephrine injection unsuccessful.  Patient discussed with Dr. Alinda Money who has recommended transfer to the [facility], ER.  I have spoken with Dr. Ralene Bathe who agrees to accept in transfer.  Final Clinical Impression(s) / ED Diagnoses Final diagnoses:  None    Rx / DC Orders ED Discharge Orders     None         Veryl Speak, MD 12/30/21 [phone]

## 2021-12-30 NOTE — ED Notes (Signed)
ED Provider at bedside. 

## 2022-01-18 ENCOUNTER — Encounter: Payer: Self-pay | Admitting: Internal Medicine

## 2022-02-14 ENCOUNTER — Other Ambulatory Visit: Payer: Self-pay

## 2022-02-14 ENCOUNTER — Ambulatory Visit (AMBULATORY_SURGERY_CENTER): Payer: Commercial Managed Care - PPO | Admitting: *Deleted

## 2022-02-14 VITALS — Ht 72.0 in | Wt 280.0 lb

## 2022-02-14 MED ORDER — NA SULFATE-K SULFATE-MG SULF 17.5-3.13-1.6 GM/177ML PO SOLN: 1.0000 | Freq: Once | ORAL | 0 refills | Status: AC

## 2022-02-14 NOTE — Progress Notes (Signed)

## 2022-02-28 ENCOUNTER — Encounter: Payer: Self-pay | Admitting: Internal Medicine

## 2022-02-28 ENCOUNTER — Ambulatory Visit (AMBULATORY_SURGERY_CENTER): Payer: Commercial Managed Care - PPO | Admitting: Internal Medicine

## 2022-02-28 VITALS — BP 122/75 | HR 84 | Temp 98.6°F | Resp 13 | Ht 72.0 in | Wt 280.0 lb

## 2022-02-28 DIAGNOSIS — Z86010 Personal history of colonic polyps: Secondary | ICD-10-CM | POA: Diagnosis present

## 2022-02-28 DIAGNOSIS — D124 Benign neoplasm of descending colon: Secondary | ICD-10-CM

## 2022-02-28 DIAGNOSIS — D125 Benign neoplasm of sigmoid colon: Secondary | ICD-10-CM | POA: Diagnosis not present

## 2022-02-28 MED ORDER — SODIUM CHLORIDE 0.9 % IV SOLN: 500.0000 mL | Freq: Once | INTRAVENOUS | Status: DC

## 2022-02-28 NOTE — Op Note (Signed)
[facility] ?Patient Name: Harold Trujillo ?Procedure Date: 02/28/2022 9:39 AM ?MRN: [id number] ?Endoscopist: Jerene Bears , MD ?Age: 61 ?Referring MD:  ?Date of Birth: October 15, 1961 ?Gender: Male ?Account #: [id number] ?Procedure:                Colonoscopy ?Indications:              High risk colon cancer surveillance: Personal  ?                          history of non-advanced adenoma, Last colonoscopy:  ?                          October 2017 ?Medicines:                Monitored Anesthesia Care ?Procedure:                Pre-Anesthesia Assessment: ?                          - Prior to the procedure, a History and Physical  ?                          was performed, and patient medications and  ?                          allergies were reviewed. The patient's tolerance of  ?                          previous anesthesia was also reviewed. The risks  ?                          and benefits of the procedure and the sedation  ?                          options and risks were discussed with the patient.  ?                          All questions were answered, and informed consent  ?                          was obtained. Prior Anticoagulants: The patient has  ?                          taken no previous anticoagulant or antiplatelet  ?                          agents. ASA Grade Assessment: II - A patient with  ?                          mild systemic disease. After reviewing the risks  ?                          and benefits, the patient was deemed in  ?  satisfactory condition to undergo the procedure. ?                          After obtaining informed consent, the colonoscope  ?                          was passed under direct vision. Throughout the  ?                          procedure, the patient's blood pressure, pulse, and  ?                          oxygen saturations were monitored continuously. The  ?                          CF [id number] #[id number] was introduced through the anus  ?                           and advanced to the cecum, identified by  ?                          appendiceal orifice and ileocecal valve. The  ?                          colonoscopy was performed without difficulty. The  ?                          patient tolerated the procedure well. The quality  ?                          of the bowel preparation was good. The ileocecal  ?                          valve, appendiceal orifice, and rectum were  ?                          photographed. ?Scope In: 9:48:38 AM ?Scope Out: 10:05:15 AM ?Scope Withdrawal Time: 0 hours 12 minutes 59 seconds  ?Total Procedure Duration: 0 hours 16 minutes 37 seconds  ?Findings:                 The digital rectal exam was normal. ?                          A 4 mm polyp was found in the descending colon. The  ?                          polyp was sessile. The polyp was removed with a  ?                          cold snare. Resection and retrieval were complete. ?                          A 7 mm polyp was found in the sigmoid colon. The  ?  polyp was sessile. The polyp was removed with a  ?                          cold snare. Resection and retrieval were complete. ?                          A few small and large-mouthed diverticula were  ?                          found in the transverse colon and hepatic flexure. ?                          Internal hemorrhoids were found during  ?                          retroflexion. The hemorrhoids were small. ?Complications:            No immediate complications. ?Estimated Blood Loss:     Estimated blood loss was minimal. ?Impression:               - One 4 mm polyp in the descending colon, removed  ?                          with a cold snare. Resected and retrieved. ?                          - One 7 mm polyp in the sigmoid colon, removed with  ?                          a cold snare. Resected and retrieved. ?                          - Diverticulosis in the transverse colon and at the   ?                          hepatic flexure. ?                          - Internal hemorrhoids. ?Recommendation:           - Patient has a contact number available for  ?                          emergencies. The signs and symptoms of potential  ?                          delayed complications were discussed with the  ?                          patient. Return to normal activities tomorrow.  ?                          Written discharge instructions were provided to the  ?  patient. ?                          - Resume previous diet. ?                          - Continue present medications. ?                          - Await pathology results. ?                          - Repeat colonoscopy is recommended for  ?                          surveillance. The colonoscopy date will be  ?                          determined after pathology results from today's  ?                          exam become available for review. ?Jerene Bears, MD ?02/28/2022 10:09:44 AM ?This report has been signed electronically. ?

## 2022-02-28 NOTE — Progress Notes (Signed)
Pt in recovery with monitors in place, VSS. Report given to receiving RN.  °

## 2022-02-28 NOTE — Progress Notes (Signed)
Pt's states no medical or surgical changes since previsit or office visit. 

## 2022-02-28 NOTE — Progress Notes (Signed)
? ?GASTROENTEROLOGY PROCEDURE H&P NOTE  ? ?Primary Care Physician: ?Coral Spikes, DO ? ? ? ?Reason for Procedure:  History of adenomatous colon polyp ? ?Plan:    Surveillance colonoscopy ? ?Patient is appropriate for endoscopic procedure(s) in the ambulatory ([facility]) setting. ? ?The nature of the procedure, as well as the risks, benefits, and alternatives were carefully and thoroughly reviewed with the patient. Ample time for discussion and questions allowed. The patient understood, was satisfied, and agreed to proceed.  ? ? ? ?HPI: ?Harold Trujillo is a 61 y.o. male who presents for colonoscopy.  Medical history as below.  Tolerated the prep.  No recent chest pain or shortness of breath.  No abdominal pain today. ? ?Past Medical History:  ?Diagnosis Date  ? ADHD (attention deficit hyperactivity disorder)   ? Alcohol abuse   ? Anxiety   ? Anxiety   ? Back pain   ? Depression   ? Depression   ? Gallbladder problem   ? High cholesterol   ? Hypertension   ? Insomnia   ? Joint pain   ? Pre-diabetes   ? Sleep apnea   ? no cpap use at this time  ? Sleep apnea   ? SOB (shortness of breath)   ? Vitamin D deficiency   ? ? ?Past Surgical History:  ?Procedure Laterality Date  ? CHOLECYSTECTOMY    ? COLONOSCOPY    ? for arm surgery  forearm surgery L   ? HERNIA REPAIR    ? POLYPECTOMY    ? ? ?Prior to Admission medications   ?Medication Sig Start Date End Date Taking? Authorizing Provider  ?anastrozole (ARIMIDEX) 1 MG tablet Take 0.5 mg by mouth 2 (two) times a week. 06/01/20  Yes [provider]  ?meloxicam (MOBIC) 15 MG tablet Take 15 mg by mouth daily. 01/28/22  Yes [provider]  ?PARoxetine (PAXIL) 10 MG tablet Take one tablet po every morning 02/09/20  Yes Mikey Kirschner, MD  ?Semaglutide, 1 MG/DOSE, (OZEMPIC, 1 MG/DOSE,) 2 MG/1.5ML SOPN Inject 1 mg into the skin once a week. 05/21/21  Yes Whitmire, Joneen Boers, FNP  ?testosterone cypionate (DEPOTESTOSTERONE CYPIONATE) 200 MG/ML injection Inject 100 mg into  the muscle 2 (two) times a week. 08/01/20  Yes [provider]  ?traMADol (ULTRAM) 50 MG tablet Take 50 mg by mouth as needed.   Yes [provider]  ?Vitamin D, Ergocalciferol, (DRISDOL) 1.25 MG (50000 UNIT) CAPS capsule Take 1 capsule (50,000 Units total) by mouth every 7 (seven) days. 05/21/21  Yes Whitmire, Joneen Boers, FNP  ?buPROPion (WELLBUTRIN SR) 150 MG 12 hr tablet Take 1 tablet (150 mg total) by mouth in the morning. 05/21/21   Whitmire, Joneen Boers, FNP  ?tadalafil (CIALIS) 5 MG tablet Take 5 mg by mouth daily. 06/19/21   [provider]  ? ? ?Current Outpatient Medications  ?Medication Sig Dispense Refill  ? anastrozole (ARIMIDEX) 1 MG tablet Take 0.5 mg by mouth 2 (two) times a week.    ? meloxicam (MOBIC) 15 MG tablet Take 15 mg by mouth daily.    ? PARoxetine (PAXIL) 10 MG tablet Take one tablet po every morning 90 tablet 1  ? Semaglutide, 1 MG/DOSE, (OZEMPIC, 1 MG/DOSE,) 2 MG/1.5ML SOPN Inject 1 mg into the skin once a week. 1.5 mL 0  ? testosterone cypionate (DEPOTESTOSTERONE CYPIONATE) 200 MG/ML injection Inject 100 mg into the muscle 2 (two) times a week.    ? traMADol (ULTRAM) 50 MG tablet Take  50 mg by mouth as needed.    ? Vitamin D, Ergocalciferol, (DRISDOL) 1.25 MG (50000 UNIT) CAPS capsule Take 1 capsule (50,000 Units total) by mouth every 7 (seven) days. 4 capsule 0  ? buPROPion (WELLBUTRIN SR) 150 MG 12 hr tablet Take 1 tablet (150 mg total) by mouth in the morning. 30 tablet 0  ? tadalafil (CIALIS) 5 MG tablet Take 5 mg by mouth daily.    ? ?Current Facility-Administered Medications  ?Medication Dose Route Frequency Provider Last Rate Last Admin  ? 0.9 %  sodium chloride infusion  500 mL Intravenous Once Ajay Strubel, Lajuan Lines, MD      ? ? ?Allergies as of 02/28/2022 - Review Complete 02/28/2022  ?Allergen Reaction Noted  ? Shellfish allergy Nausea And Vomiting 12/08/2013  ? Penicillins Other (See Comments) 09/05/2011  ? ? ?Family History  ?Problem Relation Age of Onset  ? Diabetes  Mother   ? Heart disease Mother   ? Sudden death Mother   ? Obesity Mother   ? Depression Father   ? Anxiety disorder Father   ? Diabetes Other   ? Colon cancer Neg Hx   ? Colon polyps Neg Hx   ? Esophageal cancer Neg Hx   ? Rectal cancer Neg Hx   ? Stomach cancer Neg Hx   ? ? ?Social History  ? ?Socioeconomic History  ? Marital status: Married  ?  Spouse name: Amardeep Beckers  ? Number of children: Not on file  ? Years of education: Not on file  ? Highest education level: Not on file  ?Occupational History  ? Occupation: Higher education careers adviser  ?  Comment: [employer]  ?Tobacco Use  ? Smoking status: Former  ?  Packs/day: 1.50  ?  Years: 20.00  ?  Pack years: 30.00  ?  Types: Cigarettes  ?  Quit date: 2009  ?  Years since quitting: 14.2  ?  Passive exposure: Never  ? Smokeless tobacco: Never  ?Vaping Use  ? Vaping Use: Never used  ?Substance and Sexual Activity  ? Alcohol use: Not Currently  ?  Alcohol/week: 12.0 standard drinks  ?  Types: 12 Cans of beer per week  ?  Comment: once a week 8-12 beers per day  ? Drug use: No  ? Sexual activity: Yes  ?Other Topics Concern  ? Not on file  ?Social History Narrative  ? Not on file  ? ?Social Determinants of Health  ? ?Financial Resource Strain: Not on file  ?Food Insecurity: Not on file  ?Transportation Needs: Not on file  ?Physical Activity: Not on file  ?Stress: Not on file  ?Social Connections: Not on file  ?Intimate Partner Violence: Not on file  ? ? ?Physical Exam: ?Vital signs in last 24 hours: ?'@BP'$  114/70   Pulse 89   Temp 98.6 ?F (37 ?C)   Ht 6' (1.829 m)   Wt 280 lb (127 kg)   SpO2 94%   BMI 37.97 kg/m?  ?GEN: NAD ?EYE: Sclerae anicteric ?ENT: MMM ?CV: Non-tachycardic ?Pulm: CTA b/l ?GI: Soft, NT/ND ?NEURO:  Alert & Oriented x 3 ? ? ?Zenovia Jarred, MD ?[facility] ? ?02/28/2022 9:41 AM ? ?

## 2022-02-28 NOTE — Patient Instructions (Signed)
Resume previous diet and current medications.  Await pathology results.  Handouts given for Hemorrhoids, diverticulosis and polyps.  ? ?YOU HAD AN ENDOSCOPIC PROCEDURE TODAY AT [facility]:   Refer to the procedure report that was given to you for any specific questions about what was found during the examination.  If the procedure report does not answer your questions, please call your gastroenterologist to clarify.  If you requested that your care partner not be given the details of your procedure findings, then the procedure report has been included in a sealed envelope for you to review at your convenience later. ? ?YOU SHOULD EXPECT: Some feelings of bloating in the abdomen. Passage of more gas than usual.  Walking can help get rid of the air that was put into your GI tract during the procedure and reduce the bloating. If you had a lower endoscopy (such as a colonoscopy or flexible sigmoidoscopy) you may notice spotting of blood in your stool or on the toilet paper. If you underwent a bowel prep for your procedure, you may not have a normal bowel movement for a few days. ? ?Please Note:  You might notice some irritation and congestion in your nose or some drainage.  This is from the oxygen used during your procedure.  There is no need for concern and it should clear up in a day or so. ? ?SYMPTOMS TO REPORT IMMEDIATELY: ? ?Following lower endoscopy (colonoscopy or flexible sigmoidoscopy): ? Excessive amounts of blood in the stool ? Significant tenderness or worsening of abdominal pains ? Swelling of the abdomen that is new, acute ? Fever of 100?F or higher ? ?For urgent or emergent issues, a gastroenterologist can be reached at any hour by calling [phone]. ?Do not use MyChart messaging for urgent concerns.  ? ? ?DIET:  We do recommend a small meal at first, but then you may proceed to your regular diet.  Drink plenty of fluids but you should avoid alcoholic beverages for 24  hours. ? ?ACTIVITY:  You should plan to take it easy for the rest of today and you should NOT DRIVE or use heavy machinery until tomorrow (because of the sedation medicines used during the test).   ? ?FOLLOW UP: ?Our staff will call the number listed on your records 48-72 hours following your procedure to check on you and address any questions or concerns that you may have regarding the information given to you following your procedure. If we do not reach you, we will leave a message.  We will attempt to reach you two times.  During this call, we will ask if you have developed any symptoms of COVID 19. If you develop any symptoms (ie: fever, flu-like symptoms, shortness of breath, cough etc.) before then, please call [phone].  If you test positive for Covid 19 in the 2 weeks post procedure, please call and report this information to [place].   ? ?If any biopsies were taken you will be contacted by phone or by letter within the next 1-3 weeks.  Please call us at [phone] if you have not heard about the biopsies in 3 weeks.  ? ? ?SIGNATURES/CONFIDENTIALITY: ?You and/or your care partner have signed paperwork which will be entered into your electronic medical record.  These signatures attest to the fact that that the information above on your After Visit Summary has been reviewed and is understood.  Full responsibility of the confidentiality of this discharge information lies with you and/or your care-partner.  ?

## 2022-03-04 ENCOUNTER — Telehealth: Payer: Self-pay

## 2022-03-04 ENCOUNTER — Encounter: Payer: Self-pay | Admitting: Internal Medicine

## 2022-03-04 NOTE — Telephone Encounter (Signed)
?  Follow up Call- ? ? ?  02/28/2022  ?  9:05 AM  ?Call back number  ?Post procedure Call Back phone  # [phone]  ?Permission to leave phone message Yes  ?  ? ?Patient questions: ? ?Do you have a fever, pain , or abdominal swelling? No. ?Pain Score  0 * ? ?Have you tolerated food without any problems? yes ? ?Have you been able to return to your normal activities? Yes.   ? ?Do you have any questions about your discharge instructions: ?Diet   No. ?Medications  No. ?Follow up visit  No. ? ?Do you have questions or concerns about your Care? No. ? ?Actions: ?* If pain score is 4 or above: ?No action needed, pain <4. ? ? ?

## 2022-07-10 ENCOUNTER — Encounter (INDEPENDENT_AMBULATORY_CARE_PROVIDER_SITE_OTHER): Payer: Self-pay

## 2023-08-19 ENCOUNTER — Ambulatory Visit (INDEPENDENT_AMBULATORY_CARE_PROVIDER_SITE_OTHER): Payer: Commercial Managed Care - PPO | Admitting: Family Medicine

## 2023-08-19 VITALS — BP 128/81 | HR 85 | Temp 98.4°F | Ht 71.5 in | Wt 268.6 lb

## 2023-08-19 DIAGNOSIS — Z125 Encounter for screening for malignant neoplasm of prostate: Secondary | ICD-10-CM | POA: Diagnosis not present

## 2023-08-19 DIAGNOSIS — R7303 Prediabetes: Secondary | ICD-10-CM

## 2023-08-19 DIAGNOSIS — E785 Hyperlipidemia, unspecified: Secondary | ICD-10-CM | POA: Diagnosis not present

## 2023-08-19 DIAGNOSIS — Z0000 Encounter for general adult medical examination without abnormal findings: Secondary | ICD-10-CM

## 2023-08-19 DIAGNOSIS — Z79899 Other long term (current) drug therapy: Secondary | ICD-10-CM

## 2023-08-19 DIAGNOSIS — F32A Depression, unspecified: Secondary | ICD-10-CM | POA: Insufficient documentation

## 2023-08-19 DIAGNOSIS — G4709 Other insomnia: Secondary | ICD-10-CM

## 2023-08-19 DIAGNOSIS — F524 Premature ejaculation: Secondary | ICD-10-CM

## 2023-08-19 MED ORDER — ALPRAZOLAM 0.5 MG PO TABS: 0.5000 mg | ORAL_TABLET | Freq: Every evening | ORAL | 1 refills | Status: DC | PRN

## 2023-08-19 MED ORDER — PAROXETINE HCL 10 MG PO TABS: 10.0000 mg | ORAL_TABLET | Freq: Every day | ORAL | 1 refills | Status: DC

## 2023-08-19 MED ORDER — TIRZEPATIDE-WEIGHT MANAGEMENT 7.5 MG/0.5ML ~~LOC~~ SOAJ: 7.5000 mg | SUBCUTANEOUS | Status: DC

## 2023-08-19 NOTE — Patient Instructions (Signed)
Labs today.  Medication sent.  Follow up in 6 months.  Take care  Dr. Adriana Simas

## 2023-08-19 NOTE — Assessment & Plan Note (Signed)
Discussed his preventative health care and this was updated in the chart today.  Labs today.  Patient will get his flu shot this year.

## 2023-08-19 NOTE — Assessment & Plan Note (Signed)
Discussed risk and benefits.  Alprazolam as directed.  Advised to use as needed.

## 2023-08-19 NOTE — Progress Notes (Signed)
Subjective:  Patient ID: Harold Trujillo, male    DOB: Mar 26, 1961  Age: 62 y.o. MRN: [id number]  CC: Annual physical exam   HPI:  62 year old male presents to establish care with me.  He is also here for an annual physical exam.  Patient states that he is overall doing fairly well.  His last colonoscopy was in 2023.  Dental exam last week.  Desires his flu vaccine later in the season.  Has gotten his shingles vaccine.  Patient would like routine labs including PSA.  Patient states that he has been losing weight.  He is on Zepbound from another provider.  He is happy with the way things are progressing.  Patient reports that he does have issues with insomnia.  Has trouble falling asleep and staying asleep.  Patient states that he was previously on alprazolam from the previous physician in our office and states that this works well for him.  He is aware of the potential risks and harms.  He uses this on a as needed basis.  He is interested in restarting this.  Patient also reports that he has premature ejaculation at times.  He would like to discuss treatment options regarding this.  Patient Active Problem List   Diagnosis Date Noted   Hyperlipidemia 08/19/2023   Annual physical exam 08/19/2023   Anxiety and depression 08/19/2023   Premature ejaculation 08/19/2023   Unilateral primary osteoarthritis, left knee 10/16/2020   Vitamin D deficiency 08/14/2020   Prediabetes 09/18/2017   Insomnia 12/12/2013   Obstructive sleep apnea 12/12/2013   Low testosterone 12/12/2013    Social Hx   Social History   Socioeconomic History   Marital status: Married    Spouse name: Hawkins Duyck   Number of children: Not on file   Years of education: Not on file   Highest education level: Not on file  Occupational History   Occupation: Manufacturing engineer    Comment: [employer]  Tobacco Use   Smoking status: Former    Current packs/day: 0.00    Average packs/day: 1.5 packs/day  for 20.0 years (30.0 ttl pk-yrs)    Types: Cigarettes    Start date: 76    Quit date: 2009    Years since quitting: 15.7    Passive exposure: Never   Smokeless tobacco: Never  Vaping Use   Vaping status: Never Used  Substance and Sexual Activity   Alcohol use: Not Currently    Alcohol/week: 12.0 standard drinks of alcohol    Types: 12 Cans of beer per week    Comment: once a week 8-12 beers per day   Drug use: No   Sexual activity: Yes  Other Topics Concern   Not on file  Social History Narrative   Not on file   Social Determinants of Health   Financial Resource Strain: Not on file  Food Insecurity: Not on file  Transportation Needs: Not on file  Physical Activity: Not on file  Stress: Not on file  Social Connections: Unknown (04/16/2022)   Received from [facility]   Social Network    Social Network: Not on file    Review of Systems Per HPI  Objective:  BP 128/81   Pulse 85   Temp 98.4 F (36.9 C)   Ht 5' 11.5" (1.816 m)   Wt 268 lb 9.6 oz (121.8 kg)   SpO2 95%   BMI 36.94 kg/m      08/19/2023    9:03 AM  02/28/2022   10:28 AM 02/28/2022   10:18 AM  BP/Weight  Systolic BP 128 122 104  Diastolic BP 81 75 71  Wt. (Lbs) 268.6    BMI 36.94 kg/m2      Physical Exam Vitals and nursing note reviewed.  Constitutional:      General: He is not in acute distress.    Appearance: Normal appearance.  HENT:     Head: Normocephalic and atraumatic.     Right Ear: Tympanic membrane normal.     Left Ear: Tympanic membrane normal.     Mouth/Throat:     Pharynx: Oropharynx is clear.  Eyes:     Comments: Mild conjunctival injection of the left eye.  Recent cataract surgery yesterday.  Cardiovascular:     Rate and Rhythm: Normal rate and regular rhythm.  Pulmonary:     Effort: Pulmonary effort is normal.     Breath sounds: Normal breath sounds. No wheezing, rhonchi or rales.  Abdominal:     General: There is no distension.     Palpations:  Abdomen is soft.     Tenderness: There is no abdominal tenderness.  Musculoskeletal:     Cervical back: Neck supple.  Skin:    General: Skin is warm.     Findings: No rash.  Neurological:     Mental Status: He is alert.  Psychiatric:        Mood and Affect: Mood normal.        Behavior: Behavior normal.     Lab Results  Component Value Date   WBC 7.6 08/03/2020   HGB 15.6 08/03/2020   HCT 50.4 08/03/2020   PLT 304 08/03/2020   GLUCOSE 96 01/01/2021   CHOL 188 01/01/2021   TRIG 180 (H) 01/01/2021   HDL 41 01/01/2021   LDLCALC 115 (H) 01/01/2021   ALT 26 01/01/2021   AST 20 01/01/2021   NA 140 01/01/2021   K 4.5 01/01/2021   CL 101 01/01/2021   CREATININE 1.12 01/01/2021   BUN 13 01/01/2021   CO2 24 01/01/2021   TSH 2.420 08/03/2020   PSA 0.60 02/17/2014   HGBA1C 5.8 (H) 08/03/2020     Assessment & Plan:   Problem List Items Addressed This Visit       Other   Prediabetes   Relevant Orders   CMP14+EGFR   Hemoglobin A1c   Premature ejaculation    Patient already on Paxil.  Increasing dose.      Hyperlipidemia   Relevant Orders   Lipid panel   Annual physical exam - Primary    Discussed his preventative health care and this was updated in the chart today.  Labs today.  Patient will get his flu shot this year.      Other Visit Diagnoses     High risk medication use       Relevant Orders   CBC   Screening PSA (prostate specific antigen)       Relevant Orders   PSA       Meds ordered this encounter  Medications   ALPRAZolam (XANAX) 0.5 MG tablet    Sig: Take 1 tablet (0.5 mg total) by mouth at bedtime as needed for anxiety.    Dispense:  30 tablet    Refill:  1   PARoxetine (PAXIL) 10 MG tablet    Sig: Take 1 tablet (10 mg total) by mouth daily. May increase to 20 mg (2 tablets) after 1 week.    Dispense:  90 tablet  Refill:  1   tirzepatide (ZEPBOUND) 7.5 MG/0.5ML Pen    Sig: Inject 7.5 mg into the skin once a week.    Follow-up:  6  months  Laurence Folz Adriana Simas DO [facility]

## 2023-08-19 NOTE — Assessment & Plan Note (Signed)
Patient already on Paxil.  Increasing dose.

## 2023-09-05 LAB — LIPID PANEL
Chol/HDL Ratio: 5.6 {ratio} — ABNORMAL HIGH (ref 0.0–5.0)
Cholesterol, Total: 178 mg/dL (ref 100–199)
HDL: 32 mg/dL — ABNORMAL LOW (ref >39)
LDL Chol Calc (NIH): 106 mg/dL — ABNORMAL HIGH (ref 0–99)
Triglycerides: 232 mg/dL — ABNORMAL HIGH (ref 0–149)
VLDL Cholesterol Cal: 40 mg/dL (ref 5–40)

## 2023-09-05 LAB — CBC
Hematocrit: 51.7 % — ABNORMAL HIGH (ref 37.5–51.0)
Hemoglobin: 16.7 g/dL (ref 13.0–17.7)
MCH: 26.8 pg (ref 26.6–33.0)
MCHC: 32.3 g/dL (ref 31.5–35.7)
MCV: 83 fL (ref 79–97)
Platelets: 265 10*3/uL (ref 150–450)
RBC: 6.24 x10E6/uL — ABNORMAL HIGH (ref 4.14–5.80)
RDW: 17.9 % — ABNORMAL HIGH (ref 11.6–15.4)
WBC: 7.2 10*3/uL (ref 3.4–10.8)

## 2023-09-05 LAB — HEMOGLOBIN A1C
Est. average glucose Bld gHb Est-mCnc: 105 mg/dL
Hgb A1c MFr Bld: 5.3 % (ref 4.8–5.6)

## 2023-09-05 LAB — CMP14+EGFR
ALT: 24 [IU]/L (ref 0–44)
AST: 24 [IU]/L (ref 0–40)
Albumin: 4.3 g/dL (ref 3.8–4.9)
Alkaline Phosphatase: 81 [IU]/L (ref 44–121)
BUN/Creatinine Ratio: 10 (ref 10–24)
BUN: 11 mg/dL (ref 8–27)
Bilirubin Total: 0.4 mg/dL (ref 0.0–1.2)
CO2: 21 mmol/L (ref 20–29)
Calcium: 9.1 mg/dL (ref 8.6–10.2)
Chloride: 101 mmol/L (ref 96–106)
Creatinine, Ser: 1.12 mg/dL (ref 0.76–1.27)
Globulin, Total: 2.2 g/dL (ref 1.5–4.5)
Glucose: 114 mg/dL — ABNORMAL HIGH (ref 70–99)
Potassium: 4.3 mmol/L (ref 3.5–5.2)
Sodium: 137 mmol/L (ref 134–144)
Total Protein: 6.5 g/dL (ref 6.0–8.5)
eGFR: 75 mL/min/{1.73_m2} (ref >59)

## 2023-09-05 LAB — PSA: Prostate Specific Ag, Serum: 1.8 ng/mL (ref 0.0–4.0)

## 2023-09-12 ENCOUNTER — Encounter: Payer: Self-pay | Admitting: Family Medicine

## 2023-09-14 ENCOUNTER — Other Ambulatory Visit: Payer: Self-pay | Admitting: Family Medicine

## 2023-09-14 DIAGNOSIS — R7303 Prediabetes: Secondary | ICD-10-CM

## 2023-09-14 DIAGNOSIS — E785 Hyperlipidemia, unspecified: Secondary | ICD-10-CM

## 2023-09-26 ENCOUNTER — Ambulatory Visit (HOSPITAL_COMMUNITY)
Admission: RE | Admit: 2023-09-26 | Discharge: 2023-09-26 | Disposition: A | Discharge status: Home / Self Care | Payer: Commercial Managed Care - PPO | Source: Ambulatory Visit | Attending: Family Medicine | Admitting: Family Medicine

## 2023-09-26 DIAGNOSIS — E785 Hyperlipidemia, unspecified: Secondary | ICD-10-CM | POA: Insufficient documentation

## 2023-09-26 DIAGNOSIS — R7303 Prediabetes: Secondary | ICD-10-CM | POA: Insufficient documentation

## 2023-10-06 ENCOUNTER — Encounter: Payer: Self-pay | Admitting: *Deleted

## 2023-10-06 ENCOUNTER — Other Ambulatory Visit: Payer: Self-pay | Admitting: Family Medicine

## 2023-10-06 MED ORDER — ROSUVASTATIN CALCIUM 10 MG PO TABS: 10.0000 mg | ORAL_TABLET | Freq: Every day | ORAL | 3 refills | Status: DC

## 2023-10-07 ENCOUNTER — Ambulatory Visit: Payer: Commercial Managed Care - PPO | Admitting: Family Medicine

## 2023-10-08 NOTE — Telephone Encounter (Signed)
Cook, Jayce G, DO   ? ?Rx sent.   ? ?

## 2023-10-10 ENCOUNTER — Ambulatory Visit: Payer: Commercial Managed Care - PPO | Admitting: Family Medicine

## 2023-10-10 VITALS — BP 142/82 | HR 89 | Temp 98.2°F | Ht 71.5 in | Wt 269.0 lb

## 2023-10-10 DIAGNOSIS — G4709 Other insomnia: Secondary | ICD-10-CM | POA: Diagnosis not present

## 2023-10-10 DIAGNOSIS — M1712 Unilateral primary osteoarthritis, left knee: Secondary | ICD-10-CM | POA: Diagnosis not present

## 2023-10-10 DIAGNOSIS — G4733 Obstructive sleep apnea (adult) (pediatric): Secondary | ICD-10-CM

## 2023-10-10 MED ORDER — IBUPROFEN 800 MG PO TABS: 800.0000 mg | ORAL_TABLET | Freq: Three times a day (TID) | ORAL | 0 refills | Status: DC | PRN

## 2023-10-10 MED ORDER — HYDROXYZINE PAMOATE 25 MG PO CAPS: 25.0000 mg | ORAL_CAPSULE | Freq: Three times a day (TID) | ORAL | 1 refills | Status: AC | PRN

## 2023-10-10 NOTE — Patient Instructions (Signed)
Medication as directed.  Referral placed for Sleep apnea.  Take care  Dr. Adriana Simas

## 2023-10-10 NOTE — Assessment & Plan Note (Signed)
Uncontrolled. Trial of hydroxyzine.

## 2023-10-10 NOTE — Progress Notes (Signed)
Subjective:  Patient ID: GOR THRAPP, male    DOB: October 04, 1961  Age: 62 y.o. MRN: [id number]  CC:   Chief Complaint  Patient presents with   trouble sleeping    HPI:  62 year old male presents for evaluation.  Patient recently seen in September.  Alprazolam was started for insomnia.  He had been on this previously.  We discussed risk and benefits.  He states that he has had improvement with continues to wake up in the middle of the night and then takes a second dose.  I advised him that presently him is habit-forming and this is not an ideal treatment for insomnia.  He would like to discuss other treatment options today.  He states that hydroxyzine has worked for him previously.  He has not responded well to trazodone or Ambien.  Additionally, patient suffers from sleep apnea.  He states that he has trouble with the apnea device.  Has not had a sleep study in many years.  I feel like this is a contributing factor.  Additionally, patient states that he has knee osteoarthritis which responds well to NSAIDs.  He would like a prescription for an anti-inflammatory.  Patient Active Problem List   Diagnosis Date Noted   Hyperlipidemia 08/19/2023   Anxiety and depression 08/19/2023   Premature ejaculation 08/19/2023   Unilateral primary osteoarthritis, left knee 10/16/2020   Vitamin D deficiency 08/14/2020   Prediabetes 09/18/2017   Insomnia 12/12/2013   Obstructive sleep apnea 12/12/2013   Low testosterone 12/12/2013    Social Hx   Social History   Socioeconomic History   Marital status: Married    Spouse name: Devaj Corcuera   Number of children: Not on file   Years of education: Not on file   Highest education level: Associate degree: occupational, Scientist, product/process development, or vocational program  Occupational History   Occupation: Manufacturing engineer    Comment: [employer]  Tobacco Use   Smoking status: Former    Current packs/day: 0.00    Average packs/day: 1.5 packs/day for  20.0 years (30.0 ttl pk-yrs)    Types: Cigarettes    Start date: 54    Quit date: 2009    Years since quitting: 15.8    Passive exposure: Never   Smokeless tobacco: Never  Vaping Use   Vaping status: Never Used  Substance and Sexual Activity   Alcohol use: Not Currently    Alcohol/week: 12.0 standard drinks of alcohol    Types: 12 Cans of beer per week    Comment: once a week 8-12 beers per day   Drug use: No   Sexual activity: Yes  Other Topics Concern   Not on file  Social History Narrative   Not on file   Social Determinants of Health   Financial Resource Strain: Low Risk  (10/09/2023)   Overall Financial Resource Strain (CARDIA)    Difficulty of Paying Living Expenses: Not hard at all  Food Insecurity: No Food Insecurity (10/09/2023)   Hunger Vital Sign    Worried About Running Out of Food in the Last Year: Never true    Ran Out of Food in the Last Year: Never true  Transportation Needs: No Transportation Needs (10/09/2023)   PRAPARE - Administrator, Civil Service (Medical): No    Lack of Transportation (Non-Medical): No  Physical Activity: Sufficiently Active (10/09/2023)   Exercise Vital Sign    Days of Exercise per Week: 5 days    Minutes of Exercise per  Session: 30 min  Stress: Not on file  Social Connections: Moderately Integrated (10/09/2023)   Social Connection and Isolation Panel [NHANES]    Frequency of Communication with Friends and Family: More than three times a week    Frequency of Social Gatherings with Friends and Family: Never    Attends Religious Services: 1 to 4 times per year    Active Member of [employer] or Organizations: No    Attends Engineer, structural: Not on file    Marital Status: Married    Review of Systems Per HPI  Objective:  BP (!) 142/82   Pulse 89   Temp 98.2 F (36.8 C)   Ht 5' 11.5" (1.816 m)   Wt 269 lb (122 kg)   SpO2 96%   BMI 36.99 kg/m      10/10/2023    8:52 AM 08/19/2023    9:03 AM 02/28/2022    10:28 AM  BP/Weight  Systolic BP 142 128 122  Diastolic BP 82 81 75  Wt. (Lbs) 269 268.6   BMI 36.99 kg/m2 36.94 kg/m2     Physical Exam Vitals and nursing note reviewed.  Constitutional:      General: He is not in acute distress.    Appearance: Normal appearance.  HENT:     Head: Normocephalic and atraumatic.  Pulmonary:     Effort: Pulmonary effort is normal. No respiratory distress.  Neurological:     Mental Status: He is alert.  Psychiatric:        Mood and Affect: Mood normal.        Behavior: Behavior normal.     Lab Results  Component Value Date   WBC 7.2 09/04/2023   HGB 16.7 09/04/2023   HCT 51.7 (H) 09/04/2023   PLT 265 09/04/2023   GLUCOSE 114 (H) 09/04/2023   CHOL 178 09/04/2023   TRIG 232 (H) 09/04/2023   HDL 32 (L) 09/04/2023   LDLCALC 106 (H) 09/04/2023   ALT 24 09/04/2023   AST 24 09/04/2023   NA 137 09/04/2023   K 4.3 09/04/2023   CL 101 09/04/2023   CREATININE 1.12 09/04/2023   BUN 11 09/04/2023   CO2 21 09/04/2023   TSH 2.420 08/03/2020   PSA 0.60 02/17/2014   HGBA1C 5.3 09/04/2023     Assessment & Plan:   Problem List Items Addressed This Visit       Respiratory   Obstructive sleep apnea    Referral placed to neurology.      Relevant Orders   Ambulatory referral to Neurology     Musculoskeletal and Integument   Unilateral primary osteoarthritis, left knee    Stable.  Rx for ibuprofen sent.      Relevant Medications   ibuprofen (ADVIL) 800 MG tablet     Other   Insomnia - Primary    Uncontrolled.  Trial of hydroxyzine.       Meds ordered this encounter  Medications   hydrOXYzine (VISTARIL) 25 MG capsule    Sig: Take 1 capsule (25 mg total) by mouth every 8 (eight) hours as needed for anxiety.    Dispense:  90 capsule    Refill:  1   ibuprofen (ADVIL) 800 MG tablet    Sig: Take 1 tablet (800 mg total) by mouth every 8 (eight) hours as needed for moderate pain (pain score 4-6).    Dispense:  90 tablet     Refill:  0   Chael Urenda DO [facility]

## 2023-10-10 NOTE — Assessment & Plan Note (Addendum)
Stable.  Rx for ibuprofen sent.

## 2023-10-10 NOTE — Assessment & Plan Note (Signed)
Referral placed to neurology

## 2023-11-12 ENCOUNTER — Encounter: Payer: Self-pay | Admitting: Neurology

## 2023-11-12 ENCOUNTER — Ambulatory Visit (INDEPENDENT_AMBULATORY_CARE_PROVIDER_SITE_OTHER): Payer: Commercial Managed Care - PPO | Admitting: Neurology

## 2023-11-12 VITALS — BP 126/74 | HR 79 | Ht 72.0 in | Wt 270.4 lb

## 2023-11-12 DIAGNOSIS — G4719 Other hypersomnia: Secondary | ICD-10-CM | POA: Diagnosis not present

## 2023-11-12 DIAGNOSIS — R519 Headache, unspecified: Secondary | ICD-10-CM

## 2023-11-12 DIAGNOSIS — G4700 Insomnia, unspecified: Secondary | ICD-10-CM | POA: Diagnosis not present

## 2023-11-12 DIAGNOSIS — G4733 Obstructive sleep apnea (adult) (pediatric): Secondary | ICD-10-CM

## 2023-11-12 DIAGNOSIS — E669 Obesity, unspecified: Secondary | ICD-10-CM

## 2023-11-12 DIAGNOSIS — R351 Nocturia: Secondary | ICD-10-CM

## 2023-11-12 NOTE — Patient Instructions (Addendum)
Thank you for choosing [facility] for your sleep related care! It was nice to meet you today!   Here is what we discussed today:    You can continue to try Melatonin at night for sleep: take 3 mg to 5 mg, 1 to 2 hours before your bedtime. You can go up to 10 mg if needed. It is over the counter and comes in pill form, chewable form and spray, if you prefer.    Please avoid taking Xanax altogether. Given your history, avoid drinking alcohol altogether.   Avoid drinking any coffee or caffeine after 3 PM.    Based on your symptoms and your exam I believe you are at risk for obstructive sleep apnea (aka OSA). We should proceed with a sleep study to determine whether you do or do not have OSA and how severe it is. Even, if you have mild OSA, I may want you to consider treatment with CPAP, as treatment of even borderline or mild sleep apnea can result and improvement of symptoms such as sleep disruption, daytime sleepiness, nighttime bathroom breaks, restless leg symptoms, improvement of headache syndromes, even improved mood disorder.   As explained, an attended sleep study (meaning you get to stay overnight in the sleep lab), lets [place] monitor sleep-related behaviors such as sleep talking and leg movements in sleep, in addition to monitoring for sleep apnea.  A home sleep test is a screening tool for sleep apnea diagnosis only, but unfortunately, does not help with any other sleep-related diagnoses.  Please remember, the long-term risks and ramifications of untreated moderate to severe obstructive sleep apnea may include (but are not limited to): increased risk for cardiovascular disease, including congestive heart failure, stroke, difficult to control hypertension, treatment resistant obesity, arrhythmias, especially irregular heartbeat commonly known as A. Fib. (atrial fibrillation); even type 2 diabetes has been linked to untreated OSA.   Other correlations that untreated  obstructive sleep apnea include macular edema which is swelling of the retina in the eyes, droopy eyelid syndrome, and elevated hemoglobin and hematocrit levels (often referred to as polycythemia).  Sleep apnea can cause disruption of sleep and sleep deprivation in most cases, which, in turn, can cause recurrent headaches, problems with memory, mood, concentration, focus, and vigilance. Most people with untreated sleep apnea report excessive daytime sleepiness, which can affect their ability to drive. Please do not drive or use heavy equipment or machinery, if you feel sleepy! Patients with sleep apnea can also develop difficulty initiating and maintaining sleep (aka insomnia).   Having sleep apnea may increase your risk for other sleep disorders, including involuntary behaviors sleep such as sleep terrors, sleep talking, sleepwalking.    Having sleep apnea can also increase your risk for restless leg syndrome and leg movements at night.   Please note that untreated obstructive sleep apnea may carry additional perioperative morbidity. Patients with significant obstructive sleep apnea (typically, in the moderate to severe degree) should receive, if possible, perioperative PAP (positive airway pressure) therapy and the surgeons and particularly the anesthesiologists should be informed of the diagnosis and the severity of the sleep disordered breathing.   We will call you or email you through MyChart with regards to your test results and plan a follow-up in sleep clinic accordingly. Most likely, you will hear from one of our nurses.   Our sleep lab administrative assistant will call you to schedule your sleep study and give you further instructions, regarding the check in process for the sleep study, arrival  time, what to bring, when you can expect to leave after the study, etc., and to answer any other logistical questions you may have. If you don't hear back from her by about 2 weeks from now, please  feel free to call her direct line at [phone] or you can call our general clinic number, or email [place] through My Chart.

## 2023-11-12 NOTE — Progress Notes (Signed)
Subjective:    Patient ID: Harold Trujillo is a 62 y.o. male.  HPI    Harold Foley, Harold Trujillo, [facility] [address], [address] [address] [place], [place] [address]  Dear Dr. Adriana Simas,  I saw your patient, Harold Trujillo, upon your kind request in my sleep clinic today for initial consultation of his sleep disorder, in particular, evaluation of his prior diagnosis of obstructive sleep apnea.  The patient is unaccompanied today.  As you know, Harold Trujillo is a 62 year old male with an underlying medical history of osteoarthritis, hyperlipidemia, hypertension, prediabetes, vitamin D deficiency, alcohol use disorder (by chart review and confirmed by patient, about 7-8 y ago), ADHD (by chart review), anxiety, depression, and obesity, who reports snoring and witnessed apneas per wife's report as well as waking up with a sense of gasping for air.  He was previously diagnosed with obstructive sleep apnea and placed on PAP therapy.  He is currently no longer on PAP therapy.  He had difficulty tolerating treatment.  He felt that his insomnia was magnified with trying his AutoPap.  He has not used it in years, he brought his machine today, he has a ResMed S9 but it download was not possible, we tried his data card and our data card, it would not read.  His Epworth sleepiness score is 15 out of 24, fatigue severity score is 45 out of 63.  Sometimes he sleeps in the bed and half the time he ends up sleeping somewhat reclined in a chair.  His chronic difficulty initiating and maintaining sleep.  I reviewed your office note from 10/10/2023.  He was advised to discontinue Xanax and start hydroxyzine as needed.  He has previously tried trazodone and Ambien which did not work well for him.  Sleep testing was several years ago, prior sleep study results are not available for my review today.  He had side effects with Ambien and trazodone.  He does not take Xanax regularly but still has some.  He has not  noticed any benefit from low-dose hydroxyzine, 25 mg at bedtime.  He takes melatonin, unclear of the dose.  He takes most of his nighttime medications only as needed and does not try to take them consistently so he does not build a habit.  He takes THC Gummies and delta 9 Gummies as well.  He works Secretary/administrator.  He lives with his wife, they have 1 dog and 2 cats in the household.  They do have a TV in the bedroom but it is typically not on at night.  Bedtime is generally between 10 and 11 and rise time around 6.  He has nocturia about once per average night and has had occasional morning headaches.  He quit smoking some 18 years ago.  He does drink alcohol, usually on the weekends in the form of beer.  He drinks caffeine in the form of coffee twice daily, once in the morning and another Around 5.  He takes a daily nap between 2 and 3 PM.  He has lost about 30 pounds in the past 5 months.  He is not aware of any family history of sleep apnea.  His Past Medical History Is Significant For: Past Medical History:  Diagnosis Date   ADHD (attention deficit hyperactivity disorder)    Alcohol abuse    Anxiety    Anxiety    Back pain    Depression    Depression    Gallbladder  problem    High cholesterol    Hypertension    Insomnia    Joint pain    Pre-diabetes    Sleep apnea    no cpap use at this time   Sleep apnea    SOB (shortness of breath)    Vitamin D deficiency     His Past Surgical History Is Significant For: Past Surgical History:  Procedure Laterality Date   CHOLECYSTECTOMY     COLONOSCOPY     for arm surgery  forearm surgery L    HERNIA REPAIR     POLYPECTOMY      His Family History Is Significant For: Family History  Problem Relation Age of Onset   Diabetes Mother    Heart disease Mother    Sudden death Mother    Obesity Mother    Depression Father    Anxiety disorder Father    Dementia Father    Diabetes Other    Colon cancer Neg Hx    Colon  polyps Neg Hx    Esophageal cancer Neg Hx    Rectal cancer Neg Hx    Stomach cancer Neg Hx     His Social History Is Significant For: Social History   Socioeconomic History   Marital status: Married    Spouse name: Andarius Dershem   Number of children: Not on file   Years of education: Not on file   Highest education level: Associate degree: occupational, Scientist, product/process development, or vocational program  Occupational History   Occupation: Manufacturing engineer    Comment: [employer]  Tobacco Use   Smoking status: Former    Current packs/day: 0.00    Average packs/day: 1.5 packs/day for 20.0 years (30.0 ttl pk-yrs)    Types: Cigarettes    Start date: 69    Quit date: 2009    Years since quitting: 15.9    Passive exposure: Never   Smokeless tobacco: Never  Vaping Use   Vaping status: Never Used  Substance and Sexual Activity   Alcohol use: Yes    Alcohol/week: 12.0 standard drinks of alcohol    Types: 12 Cans of beer per week    Comment: once a week 8-12 beers per day   Drug use: No   Sexual activity: Yes  Other Topics Concern   Not on file  Social History Narrative   Caffiene: coffee 2 cups daily   Work [employer]   Live home wife, 3 pets    Social Determinants of Health   Financial Resource Strain: Low Risk  (10/09/2023)   Overall Financial Resource Strain (CARDIA)    Difficulty of Paying Living Expenses: Not hard at all  Food Insecurity: No Food Insecurity (10/09/2023)   Hunger Vital Sign    Worried About Running Out of Food in the Last Year: Never true    Ran Out of Food in the Last Year: Never true  Transportation Needs: No Transportation Needs (10/09/2023)   PRAPARE - Administrator, Civil Service (Medical): No    Lack of Transportation (Non-Medical): No  Physical Activity: Sufficiently Active (10/09/2023)   Exercise Vital Sign    Days of Exercise per Week: 5 days    Minutes of Exercise per Session: 30 min  Stress: Not on file  Social  Connections: Moderately Integrated (10/09/2023)   Social Connection and Isolation Panel [NHANES]    Frequency of Communication with Friends and Family: More than three times a week    Frequency of Social  Gatherings with Friends and Family: Never    Attends Religious Services: 1 to 4 times per year    Active Member of [employer] or Organizations: No    Attends Engineer, structural: Not on file    Marital Status: Married    His Allergies Are:  Allergies  Allergen Reactions   Shellfish Allergy Nausea And Vomiting   Penicillins Other (See Comments)    Mom said I had reaction as a baby-per pt Has patient had a PCN reaction causing immediate rash, facial/tongue/throat swelling, SOB or lightheadedness with hypotension: unknown Has patient had a PCN reaction causing severe rash involving mucus membranes or skin necrosis:unknown Has patient had a PCN reaction that required hospitalization? unknown Has patient had a PCN reaction occurring within the last 10 years: No If all of the above answers are "NO", then may proceed with Cephalosporin use.   :   His Current Medications Are:  Outpatient Encounter Medications as of 11/12/2023  Medication Sig   ALPRAZolam (XANAX) 0.5 MG tablet Take 1 tablet (0.5 mg total) by mouth at bedtime as needed for anxiety.   anastrozole (ARIMIDEX) 1 MG tablet Take 0.5 mg by mouth 2 (two) times a week.   hydrOXYzine (VISTARIL) 25 MG capsule Take 1 capsule (25 mg total) by mouth every 8 (eight) hours as needed for anxiety.   ibuprofen (ADVIL) 800 MG tablet Take 1 tablet (800 mg total) by mouth every 8 (eight) hours as needed for moderate pain (pain score 4-6).   PARoxetine (PAXIL) 10 MG tablet Take 1 tablet (10 mg total) by mouth daily. May increase to 20 mg (2 tablets) after 1 week.   rosuvastatin (CRESTOR) 10 MG tablet Take 1 tablet (10 mg total) by mouth daily.   testosterone cypionate (DEPOTESTOSTERONE CYPIONATE) 200 MG/ML injection Inject 100 mg into the muscle  2 (two) times a week.   tirzepatide (ZEPBOUND) 7.5 MG/0.5ML Pen Inject 7.5 mg into the skin once a week.   No facility-administered encounter medications on file as of 11/12/2023.  :   Review of Systems:  Out of a complete 14 point review of systems, all are reviewed and negative with the exception of these symptoms as listed below:  Review of Systems  Neurological:        Hx osa, not using cpap, brought machine.  SS years ago [facility]/[place].  Issues with not being about to sleep.     Objective:  Neurological Exam  Physical Exam Physical Examination:   Vitals:   11/12/23 0855  BP: 126/74  Pulse: 79  SpO2: 97%   General Examination: The patient is a very pleasant 62 y.o. male in no acute distress. He appears well-developed and well-nourished and well groomed.   HEENT: Normocephalic, atraumatic, pupils are equal, round and reactive to light, extraocular tracking is good without limitation to gaze excursion or nystagmus noted. Hearing is grossly intact. Face is symmetric with normal facial animation. Speech is clear with no dysarthria noted. There is no hypophonia. There is no lip, neck/head, jaw or voice tremor. Neck is supple with full range of passive and active motion. There are no carotid bruits on auscultation. Oropharynx exam reveals: mild mouth dryness, adequate dental hygiene, moderate airway crowding secondary to larger uvula, Mallampati class III, redundant soft palate, tonsils on the smaller side.  No significant overbite, slight crossbite.  Neck circumference 18 three-quarter inches.  Tongue protrudes centrally and palate elevates symmetrically, tip of uvula not fully visualized.   Chest: Clear to auscultation without wheezing,  rhonchi or crackles noted.  Heart: S1+S2+0, regular and normal without murmurs, rubs or gallops noted.   Abdomen: Soft, non-tender and non-distended.  Extremities: There is no pitting edema in the distal lower extremities bilaterally.   Skin:  Warm and dry without trophic changes noted.   Musculoskeletal: exam reveals no obvious joint deformities.   Neurologically:  Mental status: The patient is awake, alert and oriented in all 4 spheres. His immediate and remote memory, attention, language skills and fund of knowledge are appropriate. There is no evidence of aphasia, agnosia, apraxia or anomia. Speech is clear with normal prosody and enunciation. Thought process is linear. Mood is normal and affect is normal.  Cranial nerves II - XII are as described above under HEENT exam.  Motor exam: Normal bulk, strength and tone is noted. There is no obvious action or resting tremor.  Fine motor skills and coordination: grossly intact.  Cerebellar testing: No dysmetria or intention tremor. There is no truncal or gait ataxia.  Sensory exam: intact to light touch in the upper and lower extremities.  Gait, station and balance: He stands easily. No veering to one side is noted. No leaning to one side is noted. Posture is age-appropriate and stance is narrow based. Gait shows normal stride length and normal pace. No problems turning are noted.   Assessment and Plan:  In summary, Harold Trujillo is a very pleasant 62 y.o.-year old male with an underlying medical history of osteoarthritis, hyperlipidemia, hypertension, prediabetes, vitamin D deficiency, alcohol use disorder (by chart review and confirmed by patient, about 7-8 y ago), ADHD (by chart review), anxiety, depression, and obesity, whose history and physical exam are concerning for sleep disordered breathing, particularly obstructive sleep apnea (OSA).  He carries a prior diagnosis of obstructive sleep apnea.  Sleep testing was several years ago and as he recalls he had moderate or severe sleep apnea.  He had trouble tolerating PAP therapy at the time.  He has a history of chronic difficulty initiating and maintaining sleep.  He is advised to talk to you about increasing the hydroxyzine to 50 mg at  bedtime, he is advised to avoid taking Xanax altogether.  He is advised to abstain from alcohol use altogether given his history.  He is advised to reduce his coffee intake and avoid drinking any caffeine after 3 PM.  We talked about my findings and the diagnosis of sleep apnea, particularly OSA, its prognosis and treatment options. We talked about medical/conservative treatments, surgical interventions and non-pharmacological approaches for symptom control. I explained, in particular, the risks and ramifications of untreated moderate to severe OSA, especially with respect to developing cardiovascular disease down the road, including congestive heart failure (CHF), difficult to treat hypertension, cardiac arrhythmias (particularly A-fib), neurovascular complications including TIA, stroke and dementia. Even type 2 diabetes has, in part, been linked to untreated OSA. Symptoms of untreated OSA may include (but may not be limited to) daytime sleepiness, nocturia (i.e. frequent nighttime urination), memory problems, mood irritability and suboptimally controlled or worsening mood disorder such as depression and/or anxiety, lack of energy, lack of motivation, physical discomfort, as well as recurrent headaches, especially morning or nocturnal headaches. We talked about the importance of maintaining a healthy lifestyle and striving for healthy weight. In addition, we talked about the importance of striving for and maintaining good sleep hygiene. I recommended a sleep study at this time. I outlined the differences between a laboratory attended sleep study which is considered more comprehensive and accurate over the  option of a home sleep test (HST); the latter may lead to underestimation of sleep disordered breathing in some instances and does not help with diagnosing upper airway resistance syndrome and is not accurate enough to diagnose primary central sleep apnea typically. I outlined possible surgical and non-surgical  treatment options of OSA, including the use of a positive airway pressure (PAP) device (i.e. CPAP, AutoPAP/APAP or BiPAP in certain circumstances), a custom-made dental device (aka oral appliance, which would require a referral to a specialist dentist or orthodontist typically, and is generally speaking not considered for patients with full dentures or edentulous state), upper airway surgical options, such as traditional UPPP (which is not considered a first-line treatment) or the Inspire device (hypoglossal nerve stimulator, which would involve a referral for consultation with an ENT surgeon, after careful selection, following inclusion criteria - also not first-line treatment). I explained the PAP treatment option to the patient in detail, as this is generally considered first-line treatment.  The patient indicated that he would be willing to try PAP therapy again, if the need arises. I explained the importance of being compliant with PAP treatment, not only for insurance purposes but primarily to improve patient's symptoms symptoms, and for the patient's long term health benefit, including to reduce His cardiovascular risks longer-term.    We will pick up our discussion about the next steps and treatment options after testing.  We will keep him posted as to the test results by phone call and/or MyChart messaging where possible.  We will plan to follow-up in sleep clinic accordingly as well.  I answered all his questions today and the patient was in agreement.   I encouraged him to call with any interim questions, concerns, problems or updates or email [place] through MyChart.  Generally speaking, sleep test authorizations may take up to 2 weeks, sometimes less, sometimes longer, the patient is encouraged to get in touch with [place] if they do not hear back from the sleep lab staff directly within the next 2 weeks.  Thank you very much for allowing me to participate in the care of this nice patient. If I can be of  any further assistance to you please do not hesitate to call me at [phone].  Sincerely,   Harold Foley, Harold Trujillo, Harold Trujillo

## 2023-11-20 ENCOUNTER — Ambulatory Visit: Payer: Commercial Managed Care - PPO | Admitting: Neurology

## 2023-11-20 DIAGNOSIS — G4719 Other hypersomnia: Secondary | ICD-10-CM

## 2023-11-20 DIAGNOSIS — G4734 Idiopathic sleep related nonobstructive alveolar hypoventilation: Secondary | ICD-10-CM

## 2023-11-20 DIAGNOSIS — G4700 Insomnia, unspecified: Secondary | ICD-10-CM

## 2023-11-20 DIAGNOSIS — E669 Obesity, unspecified: Secondary | ICD-10-CM

## 2023-11-20 DIAGNOSIS — R351 Nocturia: Secondary | ICD-10-CM

## 2023-11-20 DIAGNOSIS — G4733 Obstructive sleep apnea (adult) (pediatric): Secondary | ICD-10-CM

## 2023-11-20 DIAGNOSIS — R519 Headache, unspecified: Secondary | ICD-10-CM

## 2023-12-09 ENCOUNTER — Telehealth: Payer: Self-pay | Admitting: *Deleted

## 2023-12-09 NOTE — Telephone Encounter (Signed)
-----   Message from True Mar sent at 12/04/2023  1:36 PM EST ----- Urgent set up requested on PAP therapy, due to severe OSA. Patient referred by PCP for re-eval of his OSA, currently not on PAP therapy, seen by me on 11/12/23, patient had a HST on 12/01/23.    Please call and notify the patient that the recent home sleep test showed obstructive sleep apnea in the severe range. I recommend treatment for this in the form of autoPAP, which means, that we don't have to bring him in for a sleep study with CPAP, but will let him start using a so called autoPAP machine at home, through a DME company (of his choice, or as per insurance requirement). The DME representative will fit the patient with a mask of choice, educate him on how to use the machine, how to put the mask on, etc. I have placed an order in the chart. Please send the order to a local DME, talk to patient, send report to referring MD. Please also reinforce the need for compliance with treatment. We will need a FU in sleep clinic for 10 weeks post-PAP set up, please arrange that with me or one of our NPs. Thanks,   True Mar, MD, PhD [facility])

## 2023-12-09 NOTE — Telephone Encounter (Signed)
 New, Adine Neysa Nena GORMAN, RN; New, Bradley; Cain, Mitchell; Ziegler, Melissa; Garcia, Patricia Received, thank you!     Previous Messages    ----- Message ----- From: Neysa Nena GORMAN, RN Sent: 12/09/2023   2:36 PM EST To: Adine Randolm Avelina Jackson; Eleanor Pulling; * Subject: new machine and DME.  urgent severe OSA        New pt , for you all,  [facility]  [place] location  Harold Trujillo Male, 63 y.o., 20-Sep-1961 MRN: [id number]  I also faxed to [phone] 20 pgs. Received confirmation.    Particia RN  Thank

## 2023-12-09 NOTE — Telephone Encounter (Signed)
 I called pt back.  He would like to use another DME, offered [facility], [place] location is 20 min from his home.  I gave him #. I relayed they will call insurance then call him with that information. Appt made as VV for 02-24-2024 at 0745.  He will call back if questions.  Verbalized understanding of plan.

## 2023-12-09 NOTE — Telephone Encounter (Signed)
 I  relayed sleep study results to pt per below.  He knows to use 4 hour or more everynight for compliance.  Autopap is what Dr. Buck recommends.  Appt to be made 2-3 months for compliance.  He had to take another call so will all him back after lunch.  He was ok with this.

## 2023-12-10 NOTE — Telephone Encounter (Signed)
 Received fax that [facility] did receive order and is currently being processed.  Conf # [id number].

## 2023-12-29 ENCOUNTER — Encounter: Payer: Self-pay | Admitting: Neurology

## 2023-12-31 ENCOUNTER — Encounter: Payer: Self-pay | Admitting: Family Medicine

## 2024-02-04 ENCOUNTER — Other Ambulatory Visit: Payer: Self-pay | Admitting: Family Medicine

## 2024-02-11 ENCOUNTER — Ambulatory Visit: Payer: Commercial Managed Care - PPO | Admitting: Family Medicine

## 2024-02-23 ENCOUNTER — Telehealth: Payer: Self-pay

## 2024-02-23 NOTE — Progress Notes (Unsigned)
 Marland Kitchen

## 2024-02-23 NOTE — Telephone Encounter (Signed)
 Please see MyChart message.

## 2024-02-24 ENCOUNTER — Telehealth: Payer: Commercial Managed Care - PPO | Admitting: Neurology

## 2024-02-24 ENCOUNTER — Telehealth: Payer: Self-pay | Admitting: *Deleted

## 2024-02-24 DIAGNOSIS — E669 Obesity, unspecified: Secondary | ICD-10-CM | POA: Diagnosis not present

## 2024-02-24 DIAGNOSIS — G4734 Idiopathic sleep related nonobstructive alveolar hypoventilation: Secondary | ICD-10-CM

## 2024-02-24 DIAGNOSIS — G4733 Obstructive sleep apnea (adult) (pediatric): Secondary | ICD-10-CM | POA: Diagnosis not present

## 2024-02-24 NOTE — Telephone Encounter (Signed)
 Sue Lush, RN; Kathyrn Sheriff; Santina Evans; Jeris Penta, [place]; 1 other Received, Thank you

## 2024-02-24 NOTE — Progress Notes (Signed)
 Interim history:   Harold Trujillo is a 63 year old male with an underlying medical history of osteoarthritis, hyperlipidemia, hypertension, prediabetes, vitamin D deficiency, Hx of AUD, ADHD (by chart review), anxiety, depression, and obesity, who presents for a MyChart video visit for follow-up consultation of his obstructive sleep apnea, after interim testing and starting home AutoPap therapy.  The patient is unaccompanied today.  He joins via phone from his home. I am located in my office at [facility], utilizing a work laptop computer for this visit.  I first met him at the request of his primary care physician on 11/12/2023, at which time he reported snoring, daytime somnolence as well as witnessed apneas.  He carried a prior diagnosis of OSA but had not been on PAP therapy for years.  He had difficulty sleeping.  He was advised to proceed with a sleep study.  He had a home sleep test through our office on 12/01/2023 which showed severe obstructive sleep apnea with a total AHI of 42.6/hour and O2 nadir of 75% with significant time below or at 88% saturation of over 15 minutes for the study, indicating nocturnal hypoxemia.  Snoring was fairly consistently in the moderate to loud range during the study.  He was advised to proceed with home AutoPap therapy.  His set up date was 01/01/2024.  He has a ResMed air sense 10 AutoSet machine.  His DME company is [employer].  Today, 02/24/2024: I reviewed his AutoPap compliance data from 01/24/2024 through 02/22/2024, which is a total of 30 days, during which time he used his machine every night with percent use days greater than 4 hours at 87%, indicating very good compliance, average usage of 5 hours and 57 minutes, residual AHI at goal at 1.1/h, 95th percentile of pressure at 10.4 cm with a range of 7 to 14 cm with EPR of 2.  Leak acceptable with the 95th percentile at 6.4 L/min.  He reports overall doing a lot better in many respects.  He feels  that he does not wake up as foggy headed.  He does not have any pressure-like frontal head pains, he feels less fatigue during the day and he has a lot less mouth dryness upon awakening.  Nevertheless, he still struggles with the occasional significant difficulty maintaining sleep or falling asleep.  He takes Xanax very sparingly and has also taken hydroxyzine as needed, sometimes takes melatonin and sometimes takes THC.  He is very motivated to continue with treatment.  He is using a nasal pillow interface with a chinstrap.  He is agreeable to proceeding with an ONO as we discussed during our visit today.  Previously:   11/12/2023: (He) reports snoring and witnessed apneas per wife's report as well as waking up with a sense of gasping for air.  He was previously diagnosed with obstructive sleep apnea and placed on PAP therapy.  He is currently no longer on PAP therapy.  He had difficulty tolerating treatment.  He felt that his insomnia was magnified with trying his AutoPap.  He has not used it in years, he brought his machine today, he has a ResMed S9 but it download was not possible, we tried his data card and our data card, it would not read.  His Epworth sleepiness score is 15 out of 24, fatigue severity score is 45 out of 63.  Sometimes he sleeps in the bed and half the time he ends up sleeping somewhat reclined in a chair.  His chronic difficulty initiating  and maintaining sleep.  I reviewed your office note from 10/10/2023.  He was advised to discontinue Xanax and start hydroxyzine as needed.  He has previously tried trazodone and Ambien which did not work well for him.  Sleep testing was several years ago, prior sleep study results are not available for my review today.  He had side effects with Ambien and trazodone.  He does not take Xanax regularly but still has some.  He has not noticed any benefit from low-dose hydroxyzine, 25 mg at bedtime.  He takes melatonin, unclear of the dose.  He takes most of  his nighttime medications only as needed and does not try to take them consistently so he does not build a habit.  He takes THC Gummies and delta 9 Gummies as well.  He works Secretary/administrator.  He lives with his wife, they have 1 dog and 2 cats in the household.  They do have a TV in the bedroom but it is typically not on at night.  Bedtime is generally between 10 and 11 and rise time around 6.  He has nocturia about once per average night and has had occasional morning headaches.  He quit smoking some 18 years ago.  He does drink alcohol, usually on the weekends in the form of beer.  He drinks caffeine in the form of coffee twice daily, once in the morning and another Around 5.  He takes a daily nap between 2 and 3 PM.  He has lost about 30 pounds in the past 5 months.  He is not aware of any family history of sleep apnea.   His Past Medical History Is Significant For: Past Medical History:  Diagnosis Date   ADHD (attention deficit hyperactivity disorder)    Alcohol abuse    Anxiety    Anxiety    Back pain    Depression    Depression    Gallbladder problem    High cholesterol    Hypertension    Insomnia    Joint pain    Pre-diabetes    Sleep apnea    no cpap use at this time   Sleep apnea    SOB (shortness of breath)    Vitamin D deficiency     His Past Surgical History Is Significant For: Past Surgical History:  Procedure Laterality Date   CHOLECYSTECTOMY     COLONOSCOPY     for arm surgery  forearm surgery L    HERNIA REPAIR     POLYPECTOMY      His Family History Is Significant For: Family History  Problem Relation Age of Onset   Diabetes Mother    Heart disease Mother    Sudden death Mother    Obesity Mother    Depression Father    Anxiety disorder Father    Dementia Father    Diabetes Other    Colon cancer Neg Hx    Colon polyps Neg Hx    Esophageal cancer Neg Hx    Rectal cancer Neg Hx    Stomach cancer Neg Hx     His Social History Is  Significant For: Social History   Socioeconomic History   Marital status: Married    Spouse name: [name]   Number of children: Not on file   Years of education: Not on file   Highest education level: Associate degree: occupational, Scientist, product/process development, or vocational program  Occupational History   Occupation: Manufacturing engineer    Comment: [employer]  Tobacco Use   Smoking status: Former    Current packs/day: 0.00    Average packs/day: 1.5 packs/day for 20.0 years (30.0 ttl pk-yrs)    Types: Cigarettes    Start date: 61    Quit date: 2009    Years since quitting: 16.2    Passive exposure: Never   Smokeless tobacco: Never  Vaping Use   Vaping status: Never Used  Substance and Sexual Activity   Alcohol use: Yes    Alcohol/week: 12.0 standard drinks of alcohol    Types: 12 Cans of beer per week    Comment: once a week 8-12 beers per day   Drug use: No   Sexual activity: Yes  Other Topics Concern   Not on file  Social History Narrative   Caffiene: coffee 2 cups daily   Work [employer]   Live home wife, 3 pets    Social Drivers of Corporate investment banker Strain: Low Risk  (10/09/2023)   Overall Financial Resource Strain (CARDIA)    Difficulty of Paying Living Expenses: Not hard at all  Food Insecurity: No Food Insecurity (10/09/2023)   Hunger Vital Sign    Worried About Running Out of Food in the Last Year: Never true    Ran Out of Food in the Last Year: Never true  Transportation Needs: No Transportation Needs (10/09/2023)   PRAPARE - Administrator, Civil Service (Medical): No    Lack of Transportation (Non-Medical): No  Physical Activity: Sufficiently Active (10/09/2023)   Exercise Vital Sign    Days of Exercise per Week: 5 days    Minutes of Exercise per Session: 30 min  Stress: Not on file  Social Connections: Moderately Integrated (10/09/2023)   Social Connection and Isolation Panel [NHANES]    Frequency of Communication  with Friends and Family: More than three times a week    Frequency of Social Gatherings with Friends and Family: Never    Attends Religious Services: 1 to 4 times per year    Active Member of [employer] or Organizations: No    Attends Engineer, structural: Not on file    Marital Status: Married    His Allergies Are:  Allergies  Allergen Reactions   Shellfish Allergy Nausea And Vomiting   Penicillins Other (See Comments)    Mom said I had reaction as a baby-per pt Has patient had a PCN reaction causing immediate rash, facial/tongue/throat swelling, SOB or lightheadedness with hypotension: unknown Has patient had a PCN reaction causing severe rash involving mucus membranes or skin necrosis:unknown Has patient had a PCN reaction that required hospitalization? unknown Has patient had a PCN reaction occurring within the last 10 years: No If all of the above answers are "NO", then may proceed with Cephalosporin use.   :   His Current Medications Are:  Outpatient Encounter Medications as of 02/24/2024  Medication Sig   ALPRAZolam (XANAX) 0.5 MG tablet TAKE ONE TABLET (0.5MG  TOTAL) BY MOUTH AT BEDTIME AS NEEDED FOR ANXIETY   anastrozole (ARIMIDEX) 1 MG tablet Take 0.5 mg by mouth 2 (two) times a week.   hydrOXYzine (VISTARIL) 25 MG capsule Take 1 capsule (25 mg total) by mouth every 8 (eight) hours as needed for anxiety.   ibuprofen (ADVIL) 800 MG tablet Take 1 tablet (800 mg total) by mouth every 8 (eight) hours as needed for moderate pain (pain score 4-6).   PARoxetine (PAXIL) 10 MG tablet Take 1 tablet (10  mg total) by mouth daily. May increase to 20 mg (2 tablets) after 1 week.   rosuvastatin (CRESTOR) 10 MG tablet Take 1 tablet (10 mg total) by mouth daily.   testosterone cypionate (DEPOTESTOSTERONE CYPIONATE) 200 MG/ML injection Inject 100 mg into the muscle 2 (two) times a week.   tirzepatide (ZEPBOUND) 7.5 MG/0.5ML Pen Inject 7.5 mg into the skin once a week.   No  facility-administered encounter medications on file as of 02/24/2024.  :  Review of Systems:  Out of a complete 14 point review of systems, all are reviewed and negative with the exception of these symptoms as listed below:  Virtual Visit via Video Note on 02/24/2024:   I connected with Harold Trujillo on 02/24/24 at  7:45 AM EDT by a video enabled telemedicine application and verified that I am speaking with the correct person using two identifiers.   I discussed the limitations of evaluation and management by telemedicine and the availability of in person appointments. The patient expressed understanding and agreed to proceed.  History of Present Illness: See above.   Observations/Objective: Pleasant, conversant, no acute distress, able to speak in full sentences.  Good comprehension. Face is symmetric with normal facial animation, speech is clear without dysarthria, hypophonia or voice tremor.  Upper body movements without restriction.  Assessment and Plan: In summary, GALVIN AVERSA is a 63 year old male with an underlying medical history of osteoarthritis, hyperlipidemia, hypertension, prediabetes, vitamin D deficiency, Hx of AUD, ADHD (by chart review), anxiety, depression, and obesity, who presents for a MyChart video visit for follow-up consultation of his obstructive sleep apnea, after interim testing and starting home AutoPap therapy.  He had a home sleep test through our office on 12/01/2023 which showed severe obstructive sleep apnea with a total AHI of 42.6/hour and O2 nadir of 75% with significant time below or at 88% saturation of over 15 minutes for the study, indicating nocturnal hypoxemia.  Snoring was fairly consistently in the moderate to loud range during the study.  He was advised to proceed with home AutoPap therapy.  His set up date was 01/01/2024.  He has a ResMed air sense 10 AutoSet machine.  His DME company is [employer].  He is compliant with treatment and has  benefited from it.  He is very motivated to continue with it.  He is still struggling with some degree of insomnia and takes as needed medications.  He is encouraged to try melatonin more consistently.  He is discouraged from using THC products.  He is advised to follow-up routinely in our clinic in 1 year to see one of our nurse practitioners.  We will proceed with an overnight pulse oximetry test while he is on AutoPap therapy to ensure that his oxygen saturations are adequate while on treatment.  We will call him with the results.   Follow Up Instructions:    I discussed the assessment and treatment plan with the patient. The patient was provided an opportunity to ask questions and all were answered. The patient agreed with the plan and demonstrated an understanding of the instructions.   The patient was advised to call back or seek an in-person evaluation if the symptoms worsen or if the condition fails to improve as anticipated.   I provided 30 minutes of non-face-to-face time during this encounter.   Huston Foley, MD

## 2024-02-24 NOTE — Patient Instructions (Signed)
 It was nice to see you again today. I am glad to hear, things are going well with your autoPAP therapy. You have adjusted well to treatment with your new machine, and you are compliant with it. You have also fulfilled the insurance-mandated compliance percentage, which is reassuring, so you can get ongoing supplies through your insurance. Please talk to your DME provider about getting replacement supplies on a regular basis. Please be sure to change your filter every month, your mask about every 3 months, hose about every 6 months, humidifier chamber about yearly. Some restrictions are imposed by your insurance carrier with regard to how frequently you can get certain supplies.  Your DME company can provide further details if necessary.   Please continue using your autoPAP regularly. While your insurance requires that you use PAP at least 4 hours each night on 70% of the nights, I recommend, that you not skip any nights and use it throughout the night if you can. Getting used to PAP and staying with the treatment long term does take time and patience and discipline. Untreated obstructive sleep apnea when it is moderate to severe can have an adverse impact on cardiovascular health and raise her risk for heart disease, arrhythmias, hypertension, congestive heart failure, stroke and diabetes. Untreated obstructive sleep apnea causes sleep disruption, nonrestorative sleep, and sleep deprivation. This can have an impact on your day to day functioning and cause daytime sleepiness and impairment of cognitive function, memory loss, mood disturbance, and problems focussing. Using PAP regularly can improve these symptoms.  As discussed, we will do an overnight oxygen level test, called ONO, and your DME company will call and set this up for one night, while you also use your autoPAP as usual. We will call you with the results. This is to make sure that your oxygen levels stay in the 90s, while you are treated with autoPAP  for your OSA. Remember, your oxygen levels dropped into the mid 70s during the home sleep test.   Please ask your primary care to resubmit your Zepbound prescription with the indication of severe obstructive sleep apnea.  We can see you in 1 year, you can see one of our nurse practitioners as you are stable.

## 2024-02-24 NOTE — Telephone Encounter (Signed)
ONO order sent to [employer].  

## 2024-03-02 ENCOUNTER — Encounter: Payer: Self-pay | Admitting: Family Medicine

## 2024-03-03 ENCOUNTER — Other Ambulatory Visit: Payer: Self-pay | Admitting: Family Medicine

## 2024-03-03 MED ORDER — TIRZEPATIDE-WEIGHT MANAGEMENT 2.5 MG/0.5ML ~~LOC~~ SOLN: 2.5000 mg | SUBCUTANEOUS | 0 refills | Status: AC

## 2024-03-03 NOTE — Telephone Encounter (Signed)
   I sent it in. I do no believe this is going to be covered.

## 2024-03-08 ENCOUNTER — Telehealth: Payer: Self-pay | Admitting: *Deleted

## 2024-03-08 ENCOUNTER — Encounter: Payer: Self-pay | Admitting: Family Medicine

## 2024-03-08 ENCOUNTER — Ambulatory Visit: Admitting: Family Medicine

## 2024-03-08 VITALS — BP 145/87 | HR 82 | Temp 98.4°F | Ht 72.0 in | Wt 264.0 lb

## 2024-03-08 DIAGNOSIS — G4709 Other insomnia: Secondary | ICD-10-CM

## 2024-03-08 MED ORDER — PAROXETINE HCL 20 MG PO TABS
20.0000 mg | ORAL_TABLET | Freq: Every day | ORAL | 3 refills | Status: AC
Start: 2024-03-08 — End: ?

## 2024-03-08 MED ORDER — BELSOMRA 10 MG PO TABS: ORAL_TABLET | ORAL | 3 refills | Status: DC

## 2024-03-08 NOTE — Assessment & Plan Note (Addendum)
 Uncontrolled, current exacerbation.  Trial of Belsomra.

## 2024-03-08 NOTE — Telephone Encounter (Signed)
 Per Delfino Lovett Prior Authorization specialist at [employer] [phone]) Reginia Forts is a formulary exclusion and is not covered by the plan for any diagnosis

## 2024-03-08 NOTE — Patient Instructions (Signed)
 Medication as prescribed.  We will reach out to [facility].  Last tetanus was in 2018.

## 2024-03-08 NOTE — Progress Notes (Addendum)
 Subjective:  Patient ID: Harold Trujillo, male    DOB: Dec 10, 1960  Age: 63 y.o. MRN: [id number]  CC:   Chief Complaint  Patient presents with   Insomnia    Insomnia x's 2 weeks     HPI:  63 year old male presents for evaluation of the above.  Patient has a longstanding history of insomnia.  Worsening recently.  He endorses compliance with CPAP.  He has used multiple agents in the past regarding insomnia.  He is currently using alprazolam.  He has used hydroxyzine, trazodone, Ambien all without significant success in the past.  He would like to discuss other treatment options today.  He states that he has trouble with sleep maintenance.  Patient Active Problem List   Diagnosis Date Noted   Hyperlipidemia 08/19/2023   Anxiety and depression 08/19/2023   Premature ejaculation 08/19/2023   Unilateral primary osteoarthritis, left knee 10/16/2020   Vitamin D deficiency 08/14/2020   Prediabetes 09/18/2017   Insomnia 12/12/2013   Obstructive sleep apnea 12/12/2013   Low testosterone 12/12/2013    Social Hx   Social History   Socioeconomic History   Marital status: Married    Spouse name: Abhijot Straughter   Number of children: Not on file   Years of education: Not on file   Highest education level: Associate degree: occupational, Scientist, product/process development, or vocational program  Occupational History   Occupation: Manufacturing engineer    Comment: [employer]  Tobacco Use   Smoking status: Former    Current packs/day: 0.00    Average packs/day: 1.5 packs/day for 20.0 years (30.0 ttl pk-yrs)    Types: Cigarettes    Start date: 77    Quit date: 2009    Years since quitting: 16.2    Passive exposure: Never   Smokeless tobacco: Never  Vaping Use   Vaping status: Never Used  Substance and Sexual Activity   Alcohol use: Yes    Alcohol/week: 12.0 standard drinks of alcohol    Types: 12 Cans of beer per week    Comment: once a week 8-12 beers per day   Drug use: No   Sexual  activity: Yes  Other Topics Concern   Not on file  Social History Narrative   Caffiene: coffee 2 cups daily   Work [employer]   Live home wife, 3 pets    Social Drivers of Corporate investment banker Strain: Low Risk  (10/09/2023)   Overall Financial Resource Strain (CARDIA)    Difficulty of Paying Living Expenses: Not hard at all  Food Insecurity: No Food Insecurity (10/09/2023)   Hunger Vital Sign    Worried About Running Out of Food in the Last Year: Never true    Ran Out of Food in the Last Year: Never true  Transportation Needs: No Transportation Needs (10/09/2023)   PRAPARE - Administrator, Civil Service (Medical): No    Lack of Transportation (Non-Medical): No  Physical Activity: Sufficiently Active (10/09/2023)   Exercise Vital Sign    Days of Exercise per Week: 5 days    Minutes of Exercise per Session: 30 min  Stress: Not on file  Social Connections: Moderately Integrated (10/09/2023)   Social Connection and Isolation Panel [NHANES]    Frequency of Communication with Friends and Family: More than three times a week    Frequency of Social Gatherings with Friends and Family: Never    Attends Religious Services: 1 to 4 times per year  Active Member of Clubs or Organizations: No    Attends Engineer, structural: Not on file    Marital Status: Married    Review of Systems Per HPI  Objective:  BP (!) 145/87   Pulse 82   Temp 98.4 F (36.9 C)   Ht 6' (1.829 m)   Wt 264 lb (119.7 kg)   SpO2 98%   BMI 35.80 kg/m      03/08/2024   10:29 AM 11/12/2023    8:55 AM 10/10/2023    8:52 AM  BP/Weight  Systolic BP 145 126 142  Diastolic BP 87 74 82  Wt. (Lbs) 264 270.4 269  BMI 35.8 kg/m2 36.67 kg/m2 36.99 kg/m2    Physical Exam Vitals and nursing note reviewed.  Constitutional:      General: He is not in acute distress.    Appearance: Normal appearance.  HENT:     Head: Normocephalic and atraumatic.  Neurological:     Mental  Status: He is alert.  Psychiatric:        Mood and Affect: Mood normal.        Behavior: Behavior normal.     Lab Results  Component Value Date   WBC 7.2 09/04/2023   HGB 16.7 09/04/2023   HCT 51.7 (H) 09/04/2023   PLT 265 09/04/2023   GLUCOSE 114 (H) 09/04/2023   CHOL 178 09/04/2023   TRIG 232 (H) 09/04/2023   HDL 32 (L) 09/04/2023   LDLCALC 106 (H) 09/04/2023   ALT 24 09/04/2023   AST 24 09/04/2023   NA 137 09/04/2023   K 4.3 09/04/2023   CL 101 09/04/2023   CREATININE 1.12 09/04/2023   BUN 11 09/04/2023   CO2 21 09/04/2023   TSH 2.420 08/03/2020   PSA 0.60 02/17/2014   HGBA1C 5.3 09/04/2023     Assessment & Plan:  Other insomnia Assessment & Plan: Uncontrolled, current exacerbation.  Trial of Belsomra.   Other orders -     PARoxetine HCl; Take 1 tablet (20 mg total) by mouth daily.  Dispense: 90 tablet; Refill: 3 -     Belsomra; 10 mg once daily, as needed, within 30 minutes of bedtime and at least 7 hours before planned time of awakening  Dispense: 30 tablet; Refill: 3    Tyshawn Keel DO [facility]

## 2024-03-08 NOTE — Addendum Note (Signed)
 Addended by: Tommie Sams on: 03/08/2024 01:02 PM   Modules accepted: Level of Service

## 2024-03-09 ENCOUNTER — Other Ambulatory Visit: Payer: Self-pay

## 2024-03-09 MED ORDER — BELSOMRA 10 MG PO TABS: ORAL_TABLET | ORAL | 3 refills | Status: DC

## 2024-03-09 MED ORDER — BELSOMRA 10 MG PO TABS: ORAL_TABLET | ORAL | 3 refills | Status: AC

## 2024-04-06 ENCOUNTER — Other Ambulatory Visit: Payer: Self-pay | Admitting: Family Medicine

## 2024-04-06 MED ORDER — ALPRAZOLAM 0.5 MG PO TABS: ORAL_TABLET | ORAL | 0 refills | Status: DC

## 2024-06-24 ENCOUNTER — Other Ambulatory Visit: Payer: Self-pay | Admitting: Family Medicine

## 2024-09-20 ENCOUNTER — Other Ambulatory Visit: Payer: Self-pay | Admitting: Family Medicine

## 2024-11-09 ENCOUNTER — Other Ambulatory Visit: Payer: Self-pay | Admitting: Family Medicine
